# Patient Record
Sex: Male | Born: 1970 | Race: White | Hispanic: Yes | Smoking: Never smoker
Health system: Southern US, Community
[De-identification: ages and names within clinical notes are randomized; demographics above are authoritative.]

## PROBLEM LIST (undated history)

## (undated) DIAGNOSIS — T7840XA Allergy, unspecified, initial encounter: Secondary | ICD-10-CM

## (undated) DIAGNOSIS — E785 Hyperlipidemia, unspecified: Secondary | ICD-10-CM

## (undated) HISTORY — PX: EYE SURGERY: SHX253

## (undated) HISTORY — PX: KNEE ARTHROSCOPY W/ ACL RECONSTRUCTION: SHX1858

## (undated) HISTORY — DX: Hyperlipidemia, unspecified: E78.5

## (undated) HISTORY — DX: Allergy, unspecified, initial encounter: T78.40XA

---

## 2015-09-02 ENCOUNTER — Telehealth: Payer: Self-pay | Admitting: Behavioral Health

## 2015-09-02 NOTE — Telephone Encounter (Signed)
Unable to reach patient at time of Pre-Visit Call.  Left message for patient to return call when available.    

## 2015-09-03 ENCOUNTER — Encounter: Payer: Self-pay | Admitting: Physician Assistant

## 2015-09-03 ENCOUNTER — Ambulatory Visit (INDEPENDENT_AMBULATORY_CARE_PROVIDER_SITE_OTHER): Payer: BLUE CROSS/BLUE SHIELD | Admitting: Physician Assistant

## 2015-09-03 VITALS — BP 100/62 | HR 64 | Temp 97.7°F | Ht 71.0 in | Wt 175.0 lb

## 2015-09-03 DIAGNOSIS — K219 Gastro-esophageal reflux disease without esophagitis: Secondary | ICD-10-CM

## 2015-09-03 DIAGNOSIS — M654 Radial styloid tenosynovitis [de Quervain]: Secondary | ICD-10-CM | POA: Diagnosis not present
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
## 2015-09-03 NOTE — Patient Instructions (Signed)
Please continue your Nexium and bland diet (See the bottom of your visit summary for comprehensive diet). Start a daily probiotic (Align, Digestive Advantage, Culturelle) Limit alcohol. Symptoms should continue to resolve. If not please call me so I can set up a Gastroenterology consult.  For the hand, please wear the brace given over the next week. Apply topical Icy Hot to the area. Tylenol if needed for pain. Avoid heavy lifting.  Let me know if these symptoms are not resolving.  Lollie Sails Disease Lollie Sails disease is inflammation of the tendon on the thumb side of the wrist. Tendons are cords of tissue that connect bones to muscles. The tendons in your hand pass through a tunnel, or sheath. A slippery layer of tissue (synovium) lets the tendons move smoothly in the sheath. With de Quervain disease, the sheath swells or thickens, causing friction and pain. The condition is also called de Quervain tendinosis and de Quervain syndrome. It occurs most often in women who are 8-4 years old. CAUSES  The exact cause of de Quervain disease is not known. It may result from:   Overusing your hands, especially with repetitive motions that involve twisting your hand or using a forceful grip.  Pregnancy.  Rheumatoid disease. RISK FACTORS You may have a greater risk for de Quervain disease if you:  Are a middle-aged woman.  Are pregnant.  Have rheumatoid arthritis.  Have diabetes.  Use your hands far more than normal, especially with a tight grip or excessive twisting. SIGNS AND SYMPTOMS Pain on the thumb side of your wrist is the main symptom of de Quervain disease. Other signs and symptoms include:  Pain that gets worse when you grasp something or turn your wrist.  Pain that extends up the forearm.  Cysts in the area of the pain.  Swelling of your wrist and hand.  A sensation of snapping in the wrist.  Trouble moving the thumb and wrist. DIAGNOSIS  Your health care  provider may diagnose de Quervain disease based on your signs and symptoms. A physical exam will also be done. A simple test Lourena Simmonds test) that involves pulling your thumb and wrist to see if this causes pain can help determine whether you have the condition. Sometimes you may need to have an X-ray.  TREATMENT  Avoiding any activity that causes pain and swelling is the best treatment. Other options include:  Wearing a splint.  Taking medicine. Anti-inflammatory medicines and corticosteroid injections may reduce inflammation and relieve pain.  Having surgery if other treatments do not work. HOME CARE INSTRUCTIONS   Using ice can be helpful after doing activities that involve the sore wrist. To apply ice to the injured area:  Put ice in a plastic bag.  Place a towel between your skin and the bag.  Leave the ice on for 20 minutes, 2-3 times a day.  Take medicines only as directed by your health care provider.  Wear your splint as directed. This will allow your hand to rest and heal. SEEK MEDICAL CARE IF:   Your pain medicine does not help.   Your pain gets worse.  You develop new symptoms. MAKE SURE YOU:   Understand these instructions.  Will watch your condition.  Will get help right away if you are not doing well or get worse.   This information is not intended to replace advice given to you by your health care provider. Make sure you discuss any questions you have with your health care provider.  Document Released: 01/26/2001 Document Revised: 05/24/2014 Document Reviewed: 09/05/2013 Elsevier Interactive Patient Education 2016 [employer]  Food Choices for Gastroesophageal Reflux Disease, Adult When you have gastroesophageal reflux disease (GERD), the foods you eat and your eating habits are very important. Choosing the right foods can help ease the discomfort of GERD. WHAT GENERAL GUIDELINES DO I NEED TO FOLLOW?  Choose fruits, vegetables, whole grains,  low-fat dairy products, and low-fat meat, fish, and poultry.  Limit fats such as oils, salad dressings, butter, nuts, and avocado.  Keep a food diary to identify foods that cause symptoms.  Avoid foods that cause reflux. These may be different for different people.  Eat frequent small meals instead of three large meals each day.  Eat your meals slowly, in a relaxed setting.  Limit fried foods.  Cook foods using methods other than frying.  Avoid drinking alcohol.  Avoid drinking large amounts of liquids with your meals.  Avoid bending over or lying down until 2-3 hours after eating. WHAT FOODS ARE NOT RECOMMENDED? The following are some foods and drinks that may worsen your symptoms: Vegetables Tomatoes. Tomato juice. Tomato and spaghetti sauce. Chili peppers. Onion and garlic. Horseradish. Fruits Oranges, grapefruit, and lemon (fruit and juice). Meats High-fat meats, fish, and poultry. This includes hot dogs, ribs, ham, sausage, salami, and bacon. Dairy Whole milk and chocolate milk. Sour cream. Cream. Butter. Ice cream. Cream cheese.  Beverages Coffee and tea, with or without caffeine. Carbonated beverages or energy drinks. Condiments Hot sauce. Barbecue sauce.  Sweets/Desserts Chocolate and cocoa. Donuts. Peppermint and spearmint. Fats and Oils High-fat foods, including [place] fries and potato chips. Other Vinegar. Strong spices, such as black pepper, white pepper, red pepper, cayenne, curry powder, cloves, ginger, and chili powder. The items listed above may not be a complete list of foods and beverages to avoid. Contact your dietitian for more information.   This information is not intended to replace advice given to you by your health care provider. Make sure you discuss any questions you have with your health care provider.   Document Released: 05/03/2005 Document Revised: 05/24/2014 Document Reviewed: 03/07/2013 Elsevier Interactive Patient Education [employer]2016  [employer]vier Inc.

## 2015-09-03 NOTE — Progress Notes (Signed)
Pre visit review using our clinic review tool, if applicable. No additional management support is needed unless otherwise documented below in the visit note. 

## 2016-03-19 ENCOUNTER — Telehealth: Payer: Self-pay | Admitting: Physician Assistant

## 2016-03-19 NOTE — Telephone Encounter (Signed)
Pt sent a My Chart message. He would like to establish care with Dr. Carmelia RollerWendling due to current PCP moving to a different office.    Please advise, is switch okay?

## 2016-03-19 NOTE — Telephone Encounter (Signed)
Ok with me 

## 2016-03-24 NOTE — Telephone Encounter (Signed)
OK with me.

## 2016-03-26 DIAGNOSIS — J301 Allergic rhinitis due to pollen: Secondary | ICD-10-CM | POA: Diagnosis not present

## 2016-03-29 DIAGNOSIS — J3089 Other allergic rhinitis: Secondary | ICD-10-CM | POA: Diagnosis not present

## 2016-03-29 NOTE — Telephone Encounter (Signed)
Called pt. lvm to call back to schedule ov to transfer care.

## 2016-04-05 ENCOUNTER — Encounter: Payer: Self-pay | Admitting: Family Medicine

## 2016-04-05 ENCOUNTER — Ambulatory Visit (INDEPENDENT_AMBULATORY_CARE_PROVIDER_SITE_OTHER): Payer: BLUE CROSS/BLUE SHIELD | Admitting: Family Medicine

## 2016-04-05 VITALS — BP 112/66 | HR 73 | Temp 97.3°F | Ht 71.0 in | Wt 178.8 lb

## 2016-04-05 DIAGNOSIS — Z13220 Encounter for screening for lipoid disorders: Secondary | ICD-10-CM | POA: Diagnosis not present

## 2016-04-05 DIAGNOSIS — Z0000 Encounter for general adult medical examination without abnormal findings: Secondary | ICD-10-CM

## 2016-04-05 DIAGNOSIS — Z114 Encounter for screening for human immunodeficiency virus [HIV]: Secondary | ICD-10-CM | POA: Diagnosis not present

## 2016-04-05 LAB — LIPID PANEL
Cholesterol: 242 mg/dL — ABNORMAL HIGH (ref 0–200)
HDL: 67.4 mg/dL (ref >39.00)
LDL Cholesterol: 145 mg/dL — ABNORMAL HIGH (ref 0–99)
NONHDL: 174.94
TRIGLYCERIDES: 151 mg/dL — AB (ref 0.0–149.0)
Total CHOL/HDL Ratio: 4
VLDL: 30.2 mg/dL (ref 0.0–40.0)

## 2016-04-05 LAB — COMPREHENSIVE METABOLIC PANEL
ALK PHOS: 40 U/L (ref 39–117)
ALT: 15 U/L (ref 0–53)
AST: 17 U/L (ref 0–37)
Albumin: 4.5 g/dL (ref 3.5–5.2)
BUN: 20 mg/dL (ref 6–23)
CALCIUM: 9.5 mg/dL (ref 8.4–10.5)
CO2: 28 mEq/L (ref 19–32)
Chloride: 105 mEq/L (ref 96–112)
Creatinine, Ser: 1.17 mg/dL (ref 0.40–1.50)
GFR: 71.99 mL/min (ref >60.00)
GLUCOSE: 73 mg/dL (ref 70–99)
POTASSIUM: 4 meq/L (ref 3.5–5.1)
Sodium: 141 mEq/L (ref 135–145)
TOTAL PROTEIN: 7.3 g/dL (ref 6.0–8.3)
Total Bilirubin: 0.4 mg/dL (ref 0.2–1.2)

## 2016-04-05 NOTE — Progress Notes (Signed)
Chief Complaint  Patient presents with  . Transitions Of Care    cpe    Well Male Kathrine CordsSandro Budde is here for a complete physical.   His last physical was >1 year(s) ago.  Current diet: in general, a "healthy" diet   Current exercise: mountain biking, tennis, light exercises in gym Weight trend: stable Does pt snore? No. Daytime fatigue? No. Seat belt? Yes.   Concerns: None  Past Medical History:  Diagnosis Date  . Allergy    with allergic asthma -- well controlled; is on injections every 3 weeks  . Hyperlipidemia     Past Surgical History:  Procedure Laterality Date  . EYE SURGERY     Lasic  . KNEE ARTHROSCOPY W/ ACL RECONSTRUCTION  2008,2011   From soccer   Medications  Current Outpatient Prescriptions on File Prior to Visit  Medication Sig Dispense Refill  . Melatonin 5 MG TABS Take 1 tablet by mouth daily.     Allergies No Known Allergies Family History Family History  Problem Relation Age of Onset  . Hypertension Mother   . Hypertension Father   . Diabetes Father     Type II  . Hypertension Brother   . Stroke Paternal Grandfather   . Healthy Son 13    Review of Systems: Constitutional:  no unexpected change in weight, no fevers or chills Eye:  no recent significant change in vision Ear/Nose/Mouth/Throat:  Ears:  no tinnitus or hearing loss Nose/Mouth/Throat:  no complaints of nasal congestion or bleeding, no sore throat and oral sores Cardiovascular:  no chest pain, no palpitations Respiratory:  no cough and no shortness of breath Gastrointestinal:  no abdominal pain, no change in bowel habits, no nausea, vomiting, diarrhea, or constipation and no black or bloody stool GU:  Male: negative for dysuria, frequency, and incontinence and negative for prostate symptoms Musculoskeletal/Extremities:  no pain, redness, or swelling of the joints Integumentary (Skin/Breast):  no abnormal skin lesions reported Neurologic:  no headaches, no numbness,  tingling Endocrine:  weight changes, masses in the neck, heat/cold intolerance, bowel or skin changes, or cardiovascular system symptoms Hematologic/Lymphatic:  no abnormal bleeding, no HIV risk factors, no night sweats, no swollen nodes, no weight loss  Exam BP 112/66 (BP Location: Left Arm, Patient Position: Sitting, Cuff Size: Small)   Pulse 73   Temp 97.3 F (36.3 C) (Oral)   Ht 5\' 11"  (1.803 m)   Wt 178 lb 12.8 oz (81.1 kg)   SpO2 97%   BMI 24.94 kg/m  General:  well developed, well nourished, in no apparent distress Skin:  no significant moles, warts, or growths Head:  no masses, lesions, or tenderness Eyes:  pupils equal and round, sclera anicteric without injection Ears:  canals without lesions, TMs shiny without retraction, no obvious effusion, no erythema Nose:  nares patent, septum midline, mucosa normal Throat/Pharynx:  lips and gingiva without lesion; tongue and uvula midline; non-inflamed pharynx; no exudates or postnasal drainage Neck: neck supple without adenopathy, thyromegaly, or masses Lungs:  clear to auscultation, breath sounds equal bilaterally, no respiratory distress Cardio:  regular rate and rhythm without murmurs, heart sounds without clicks or rubs Abdomen:  abdomen soft, nontender; bowel sounds normal; no masses or organomegaly Genital (male): Uncircumcised penis, no lesions or discharge; testes present bilaterally without masses or tenderness Rectal: Deferred Musculoskeletal:  symmetrical muscle groups noted without atrophy or deformity Extremities:  no clubbing, cyanosis, or edema, no deformities, no skin discoloration Neuro:  gait normal; deep tendon reflexes normal  and symmetric Psych: well oriented with normal range of affect and appropriate judgment/insight  Assessment and Plan  Well adult exam - Plan: Comprehensive metabolic panel  Screening cholesterol level - Plan: Lipid panel  Encounter for screening for HIV - Plan: HIV antibody   Well 45  y.o. male. Counseled on diet and exercise. Immunizations, labs, and further orders as above. Follow up in 1 year pending the above workup. The patient voiced understanding and agreement to the plan.  Jilda Rocheicholas Paul Bon[place]Wendling, DO 04/05/16 2:10 PM

## 2016-04-05 NOTE — Progress Notes (Signed)
Pre visit review using our clinic review tool, if applicable. No additional management support is needed unless otherwise documented below in the visit note. 

## 2016-04-06 LAB — HIV ANTIBODY (ROUTINE TESTING W REFLEX): HIV 1&2 Ab, 4th Generation: NONREACTIVE

## 2016-04-13 DIAGNOSIS — S43101D Unspecified dislocation of right acromioclavicular joint, subsequent encounter: Secondary | ICD-10-CM | POA: Diagnosis not present

## 2017-03-30 ENCOUNTER — Telehealth: Payer: Self-pay | Admitting: Family Medicine

## 2017-03-30 NOTE — Telephone Encounter (Signed)
OK w me.  

## 2017-03-30 NOTE — Telephone Encounter (Signed)
l looks like he was seen about a year ago. So looks like due to CPE. Have him schedule sometime early December. Fasting early morning appointment. I am ok if he wants to switch.

## 2017-03-30 NOTE — Telephone Encounter (Signed)
Pt called states he did not feel a connection with Wendling and would like to switch to [facility] as his [facility]. Please advise. Call pt on mobile phone with decision.

## 2017-04-01 NOTE — Telephone Encounter (Signed)
Called pt x 2 unable to leave msg. Pt is ok to schedule appt with edward per previous note.

## 2017-04-13 ENCOUNTER — Encounter: Payer: Self-pay | Admitting: Family Medicine

## 2017-04-13 ENCOUNTER — Ambulatory Visit: Payer: BLUE CROSS/BLUE SHIELD | Admitting: Family Medicine

## 2017-04-13 VITALS — BP 122/70 | HR 73 | Temp 98.5°F | Ht 71.0 in | Wt 187.5 lb

## 2017-04-13 DIAGNOSIS — Z23 Encounter for immunization: Secondary | ICD-10-CM

## 2017-04-13 DIAGNOSIS — R5383 Other fatigue: Secondary | ICD-10-CM | POA: Diagnosis not present

## 2017-04-13 DIAGNOSIS — Z0000 Encounter for general adult medical examination without abnormal findings: Secondary | ICD-10-CM

## 2017-04-13 NOTE — Patient Instructions (Signed)
Come to your labs fasting for 9-12 hours prior to the draw.   Keep up the good work.  Let us know if you need anything.

## 2017-04-13 NOTE — Progress Notes (Signed)
Pre visit review using our clinic review tool, if applicable. No additional management support is needed unless otherwise documented below in the visit note. 

## 2017-04-13 NOTE — Progress Notes (Signed)
Chief Complaint  Patient presents with  . Annual Exam    Well Male Bradley Stephenson is here for a complete physical.   His last physical was >1 year ago.  Current diet: in general, a "healthy" diet   Current exercise: mountain biking, lifting weights Weight trend: Has been stable Does pt snore? No. Daytime fatigue? Yes.  Seat belt? Yes.    Health maintenance Tetanus- Yes HIV- Yes   One year ago, pt had a shoulder injury and stopped workout out as much. He gained around 16 lbs. In the past, when something like this happened, the weight would shed when he started resuming his normal activity, but this time it didn't. He has been back to his overall normal routine since 3 mo ago and has not lost the weight. He is also having daytime fatigue. He denies heme issues, snoring, depressed mood, or thyroid issues. His brother has a hx of low T. His libido is unchanged.   Past Medical History:  Diagnosis Date  . Allergy    with allergic asthma -- well controlled; is on injections every 3 weeks  . Hyperlipidemia     Past Surgical History:  Procedure Laterality Date  . EYE SURGERY     Lasic  . KNEE ARTHROSCOPY W/ ACL RECONSTRUCTION  2008,2011   From soccer   Medications  Current Outpatient Medications on File Prior to Visit  Medication Sig Dispense Refill  . Melatonin 5 MG TABS Take 1 tablet by mouth daily.     Allergies No Known Allergies   Family History Family History  Problem Relation Age of Onset  . Hypertension Mother   . Hypertension Father   . Diabetes Father        Type II  . Hypertension Brother   . Stroke Paternal Grandfather   . Healthy Son 13    Review of Systems: Constitutional: no fevers or chills Eye:  no recent significant change in vision Ear/Nose/Mouth/Throat:  Ears:  no tinnitus or hearing loss Nose/Mouth/Throat:  no complaints of nasal congestion or bleeding, no sore throat Cardiovascular:  no chest pain, no palpitations Respiratory:  no cough and no  shortness of breath Gastrointestinal:  no abdominal pain, no change in bowel habits, no nausea, vomiting, diarrhea, or constipation and no black or bloody stool GU:  Male: negative for dysuria, frequency, and incontinence and negative for prostate symptoms Musculoskeletal/Extremities:  no pain, redness, or swelling of the joints Integumentary (Skin/Breast):  no abnormal skin lesions reported Neurologic:  no headaches, no numbness, tingling Endocrine: +fatigue/weight gain Hematologic/Lymphatic:  no night sweats  Exam BP 122/70 (BP Location: Left Arm, Patient Position: Sitting, Cuff Size: Normal)   Pulse 73   Temp 98.5 F (36.9 C) (Oral)   Ht 5\' 11"  (1.803 m)   Wt 187 lb 8 oz (85 kg)   SpO2 96%   BMI 26.15 kg/m  General:  well developed, well nourished, in no apparent distress Skin:  no significant moles, warts, or growths Head:  no masses, lesions, or tenderness Eyes:  pupils equal and round, sclera anicteric without injection Ears:  canals without lesions, TMs shiny without retraction, no obvious effusion, no erythema Nose:  nares patent, septum midline, mucosa normal Throat/Pharynx:  lips and gingiva without lesion; tongue and uvula midline; non-inflamed pharynx; no exudates or postnasal drainage Neck: neck supple without adenopathy, thyromegaly, or masses Lungs:  clear to auscultation, breath sounds equal bilaterally, no respiratory distress Cardio:  regular rate and rhythm without murmurs, heart sounds without clicks  or rubs Abdomen:  abdomen soft, nontender; bowel sounds normal; no masses or organomegaly Genital (male): Nml penis, no lesions or discharge; testes present bilaterally without masses or tenderness Rectal: Deferred Musculoskeletal:  symmetrical muscle groups noted without atrophy or deformity Extremities:  no clubbing, cyanosis, or edema, no deformities, no skin discoloration Neuro:  gait normal; deep tendon reflexes normal and symmetric Psych: well oriented with  normal range of affect and appropriate judgment/insight  Assessment and Plan  Well adult exam - Plan: CBC, Comprehensive metabolic panel, Lipid panel  Fatigue, unspecified type - Plan: TSH, Testosterone  Need for influenza vaccination - Plan: Flu Vaccine QUAD 6+ mos PF IM (Fluarix Quad PF)   Well 46 y.o. male. Counseled on diet and exercise. He is doing well. F/u for fasting labs. He is transitioning care to a new provider in the office and I will defer further management to him after managing the labs I am ordering.  Other orders as above. The patient voiced understanding and agreement to the plan.  Jilda Rocheicholas Paul [place], DO 04/13/17 4:00 PM

## 2017-04-15 ENCOUNTER — Other Ambulatory Visit (INDEPENDENT_AMBULATORY_CARE_PROVIDER_SITE_OTHER): Payer: BLUE CROSS/BLUE SHIELD

## 2017-04-15 DIAGNOSIS — R5383 Other fatigue: Secondary | ICD-10-CM | POA: Diagnosis not present

## 2017-04-15 DIAGNOSIS — Z0000 Encounter for general adult medical examination without abnormal findings: Secondary | ICD-10-CM | POA: Diagnosis not present

## 2017-04-15 LAB — COMPREHENSIVE METABOLIC PANEL
ALT: 27 U/L (ref 0–53)
AST: 21 U/L (ref 0–37)
Albumin: 4.4 g/dL (ref 3.5–5.2)
Alkaline Phosphatase: 34 U/L — ABNORMAL LOW (ref 39–117)
BILIRUBIN TOTAL: 0.5 mg/dL (ref 0.2–1.2)
BUN: 14 mg/dL (ref 6–23)
CHLORIDE: 102 meq/L (ref 96–112)
CO2: 31 meq/L (ref 19–32)
Calcium: 9.9 mg/dL (ref 8.4–10.5)
Creatinine, Ser: 1.11 mg/dL (ref 0.40–1.50)
GFR: 76.14 mL/min (ref >60.00)
GLUCOSE: 90 mg/dL (ref 70–99)
POTASSIUM: 4.1 meq/L (ref 3.5–5.1)
Sodium: 138 mEq/L (ref 135–145)
Total Protein: 7.3 g/dL (ref 6.0–8.3)

## 2017-04-15 LAB — CBC
HEMATOCRIT: 42.8 % (ref 39.0–52.0)
HEMOGLOBIN: 14.4 g/dL (ref 13.0–17.0)
MCHC: 33.6 g/dL (ref 30.0–36.0)
MCV: 88.8 fl (ref 78.0–100.0)
PLATELETS: 229 10*3/uL (ref 150.0–400.0)
RBC: 4.81 Mil/uL (ref 4.22–5.81)
RDW: 12.9 % (ref 11.5–15.5)
WBC: 5.1 10*3/uL (ref 4.0–10.5)

## 2017-04-15 LAB — LIPID PANEL
CHOL/HDL RATIO: 4
Cholesterol: 269 mg/dL — ABNORMAL HIGH (ref 0–200)
HDL: 60.1 mg/dL (ref >39.00)
LDL CALC: 191 mg/dL — AB (ref 0–99)
NONHDL: 209.21
Triglycerides: 89 mg/dL (ref 0.0–149.0)
VLDL: 17.8 mg/dL (ref 0.0–40.0)

## 2017-04-15 LAB — TSH: TSH: 1.92 u[IU]/mL (ref 0.35–4.50)

## 2017-04-15 LAB — TESTOSTERONE: TESTOSTERONE: 370.36 ng/dL (ref 300.00–890.00)

## 2017-04-18 ENCOUNTER — Other Ambulatory Visit: Payer: Self-pay | Admitting: Family Medicine

## 2017-04-18 DIAGNOSIS — E785 Hyperlipidemia, unspecified: Secondary | ICD-10-CM

## 2017-05-03 ENCOUNTER — Other Ambulatory Visit (INDEPENDENT_AMBULATORY_CARE_PROVIDER_SITE_OTHER): Payer: BLUE CROSS/BLUE SHIELD

## 2017-05-03 ENCOUNTER — Other Ambulatory Visit: Payer: BLUE CROSS/BLUE SHIELD

## 2017-05-03 DIAGNOSIS — E785 Hyperlipidemia, unspecified: Secondary | ICD-10-CM | POA: Diagnosis not present

## 2017-05-03 LAB — LIPID PANEL
CHOL/HDL RATIO: 3
Cholesterol: 229 mg/dL — ABNORMAL HIGH (ref 0–200)
HDL: 68.9 mg/dL (ref >39.00)
LDL Cholesterol: 138 mg/dL — ABNORMAL HIGH (ref 0–99)
NONHDL: 160.38
Triglycerides: 114 mg/dL (ref 0.0–149.0)
VLDL: 22.8 mg/dL (ref 0.0–40.0)

## 2017-10-17 ENCOUNTER — Ambulatory Visit (HOSPITAL_BASED_OUTPATIENT_CLINIC_OR_DEPARTMENT_OTHER)
Admission: RE | Admit: 2017-10-17 | Discharge: 2017-10-17 | Disposition: A | Discharge status: Home / Self Care | Payer: BLUE CROSS/BLUE SHIELD | Source: Ambulatory Visit | Attending: Family | Admitting: Family

## 2017-10-17 ENCOUNTER — Ambulatory Visit: Payer: BLUE CROSS/BLUE SHIELD | Admitting: Family

## 2017-10-17 ENCOUNTER — Encounter: Payer: Self-pay | Admitting: Family

## 2017-10-17 VITALS — BP 148/93 | HR 68 | Temp 98.8°F | Resp 16 | Ht 71.0 in | Wt 189.2 lb

## 2017-10-17 DIAGNOSIS — M79671 Pain in right foot: Secondary | ICD-10-CM

## 2017-10-17 DIAGNOSIS — M19071 Primary osteoarthritis, right ankle and foot: Secondary | ICD-10-CM | POA: Insufficient documentation

## 2017-10-17 NOTE — Patient Instructions (Signed)
Please complete x-ray on the first floor.  We will contact you via mychart with your results.

## 2017-10-17 NOTE — Progress Notes (Signed)
Subjective:    Patient ID: Bradley Stephenson, male    DOB: 12/31/1970, 47 y.o.   MRN: [id number]  HPI  Bradley Stephenson is a 47 yr old male who presents today with chief complaint of right sided foot pain. Reports that he injured her foot on Saturday while playing soccer.  Was playing soccer barefoot and went to kick the ball at the same time as his opponent.  Developed pain. Took cataflam and elevated foot. Doesn't hurt that much except to bend the right great toe. Wore some thick soled shoes yesterday and did not have much pain. Wants to make sure it is not broken because he wants to go mountain biking.    Review of Systems See HPI  Past Medical History:  Diagnosis Date  . Allergy    with allergic asthma -- well controlled; is on injections every 3 weeks  . Hyperlipidemia      Social History   Socioeconomic History  . Marital status: Married    Spouse name: Not on file  . Number of children: 1  . Years of education: Not on file  . Highest education level: Not on file  Occupational History  . Occupation: Electronics engineer: [employer]  Social Needs  . Financial resource strain: Not on file  . Food insecurity:    Worry: Not on file    Inability: Not on file  . Transportation needs:    Medical: Not on file    Non-medical: Not on file  Tobacco Use  . Smoking status: Never Smoker  . Smokeless tobacco: Never Used  Substance and Sexual Activity  . Alcohol use: Yes    Alcohol/week: 2.4 oz    Types: 4 Standard drinks or equivalent per week    Comment: Social   . Drug use: No  . Sexual activity: Yes    Partners: Female  Lifestyle  . Physical activity:    Days per week: Not on file    Minutes per session: Not on file  . Stress: Not on file  Relationships  . Social connections:    Talks on phone: Not on file    Gets together: Not on file    Attends religious service: Not on file    Active member of club or organization: Not on file    Attends meetings of clubs or  organizations: Not on file    Relationship status: Not on file  . Intimate partner violence:    Fear of current or ex partner: Not on file    Emotionally abused: Not on file    Physically abused: Not on file    Forced sexual activity: Not on file  Other Topics Concern  . Not on file  Social History Narrative  . Not on file    Past Surgical History:  Procedure Laterality Date  . EYE SURGERY     Lasic  . KNEE ARTHROSCOPY W/ ACL RECONSTRUCTION  2008,2011   From soccer    Family History  Problem Relation Age of Onset  . Hypertension Mother   . Hypertension Father   . Diabetes Father        Type II  . Hypertension Brother   . Stroke Paternal Grandfather   . Healthy Son 13    No Known Allergies  Current Outpatient Medications on File Prior to Visit  Medication Sig Dispense Refill  . Melatonin 5 MG TABS Take 1 tablet by mouth daily.     No current facility-administered medications  on file prior to visit.     BP (!) 148/93 (BP Location: Left Arm, Cuff Size: Normal)   Pulse 68   Temp 98.8 F (37.1 C) (Oral)   Resp 16   Ht 5\' 11"  (1.803 m)   Wt 189 lb 3.2 oz (85.8 kg)   SpO2 98%   BMI 26.39 kg/m       Objective:   Physical Exam  Constitutional: He is oriented to person, place, and time. He appears well-developed and well-nourished.  HENT:  Head: Normocephalic and atraumatic.  Eyes: No scleral icterus.  Musculoskeletal:  + ecchymosis base of right great toe with mild associated swelling,  No erythema  Neurological: He is alert and oriented to person, place, and time.          Assessment & Plan:  R foot pain- will obtain x-ray to rule out fracture. If + for fracture will plan referral to sports med.

## 2018-03-27 DIAGNOSIS — H16142 Punctate keratitis, left eye: Secondary | ICD-10-CM | POA: Diagnosis not present

## 2018-03-27 DIAGNOSIS — T2612XA Burn of cornea and conjunctival sac, left eye, initial encounter: Secondary | ICD-10-CM | POA: Diagnosis not present

## 2018-03-27 DIAGNOSIS — H1712 Central corneal opacity, left eye: Secondary | ICD-10-CM | POA: Diagnosis not present

## 2018-06-27 ENCOUNTER — Telehealth: Payer: Self-pay

## 2018-06-27 NOTE — Telephone Encounter (Signed)
Okay to schedule NP appt w/ Dr. Drue Novel at his convenience.

## 2018-06-27 NOTE — Telephone Encounter (Signed)
Please advise 

## 2018-06-27 NOTE — Telephone Encounter (Signed)
Copied from CRM [id number]. Topic: Appointment Scheduling - Transfer of Care >> Jun 27, 2018  1:45 PM Angela Nevin wrote: Pt is requesting to transfer FROM: Dr.Wendling  Pt is requesting to transfer TO: Dr. Drue Novel Reason for requested transfer: Just does not feel like Dr. Carmelia Roller is a good fit for him.

## 2018-06-27 NOTE — Telephone Encounter (Signed)
Ok if ok w/ Dr Carmelia RollerWendling

## 2018-06-28 NOTE — Telephone Encounter (Signed)
Looks like the appt on 08/02/2018 was accidentally scheduled w/ Dr. Carmelia Roller.

## 2018-08-02 ENCOUNTER — Encounter: Payer: BLUE CROSS/BLUE SHIELD | Admitting: Internal Medicine

## 2018-08-02 ENCOUNTER — Encounter: Payer: BLUE CROSS/BLUE SHIELD | Admitting: Family Medicine

## 2019-01-08 ENCOUNTER — Other Ambulatory Visit: Payer: Self-pay

## 2019-01-09 ENCOUNTER — Encounter: Payer: Self-pay | Admitting: Internal Medicine

## 2019-01-09 ENCOUNTER — Ambulatory Visit (INDEPENDENT_AMBULATORY_CARE_PROVIDER_SITE_OTHER): Payer: BC Managed Care – PPO | Admitting: Internal Medicine

## 2019-01-09 VITALS — BP 144/95 | HR 63 | Temp 97.9°F | Resp 16 | Ht 70.0 in | Wt 182.0 lb

## 2019-01-09 DIAGNOSIS — Z0000 Encounter for general adult medical examination without abnormal findings: Secondary | ICD-10-CM | POA: Insufficient documentation

## 2019-01-09 NOTE — Patient Instructions (Signed)
   GO TO THE FRONT DESK Schedule labs for tomorrow morning Schedule  your next appointment for a physical  In 1 year       Check the  blood pressure 2   times a month   BP GOAL is between 110/65 and  135/85. If it is consistently higher or lower, let me know

## 2019-01-09 NOTE — Progress Notes (Signed)
Subjective:    Patient ID: Bradley Stephenson, male    DOB: 01/09/1971, 48 y.o.   MRN: [id number]  DOS:  01/09/2019 Type of visit - description: Physical exam, new patient to me Has no major concerns.  From time to time he feels slightly tired but otherwise he is very active, goes to the gym 3 times a week does mountain biking.   Review of Systems  Other than above, a 14 point review of systems is negative     Past Medical History:  Diagnosis Date  . Allergy    with allergic asthma -- well controlled; is on injections every 3 weeks  . Hyperlipidemia     Past Surgical History:  Procedure Laterality Date  . EYE SURGERY     Lasic  . KNEE ARTHROSCOPY W/ ACL RECONSTRUCTION Bilateral 2008,2011   From soccer    Social History   Socioeconomic History  . Marital status: Married    Spouse name: Not on file  . Number of children: 1  . Years of education: Not on file  . Highest education level: Not on file  Occupational History  . Occupation: Electronics engineerMachanical Engineer    Employer: [employer]  Social Needs  . Financial resource strain: Not on file  . Food insecurity    Worry: Not on file    Inability: Not on file  . Transportation needs    Medical: Not on file    Non-medical: Not on file  Tobacco Use  . Smoking status: Never Smoker  . Smokeless tobacco: Never Used  Substance and Sexual Activity  . Alcohol use: Yes    Alcohol/week: 4.0 standard drinks    Types: 4 Standard drinks or equivalent per week    Comment: Social   . Drug use: No  . Sexual activity: Yes    Partners: Female  Lifestyle  . Physical activity    Days per week: Not on file    Minutes per session: Not on file  . Stress: Not on file  Relationships  . Social Musicianconnections    Talks on phone: Not on file    Gets together: Not on file    Attends religious service: Not on file    Active member of club or organization: Not on file    Attends meetings of clubs or organizations: Not on file    Relationship status: Not  on file  . Intimate partner violence    Fear of current or ex partner: Not on file    Emotionally abused: Not on file    Physically abused: Not on file    Forced sexual activity: Not on file  Other Topics Concern  . Not on file  Social History Narrative   From [place] , [place]   Active, gym, MT[employer]     Family History  Problem Relation Age of Onset  . Hypertension Mother   . Hypertension Father   . Diabetes Father        Type II  . Hypertension Brother   . Stroke Paternal Grandfather   . Healthy Son [id number]  . Colon cancer Neg Hx   . Prostate cancer Neg Hx      Allergies as of 01/09/2019   No Known Allergies     Medication List       Accurate as of January 09, 2019 11:59 PM. If you have any questions, ask your nurse or doctor.        Melatonin 5 MG Tabs Take 1 tablet by mouth  daily.           Objective:   Physical Exam BP (!) 144/95 (BP Location: Left Arm, Patient Position: Sitting, Cuff Size: Small)   Pulse 63   Temp 97.9 F (36.6 C) (Temporal)   Resp 16   Ht 5\' 10"  (1.778 m)   Wt 182 lb (82.6 kg)   SpO2 99%   BMI 26.11 kg/m  General: Well developed, NAD, BMI noted Neck: No  thyromegaly  HEENT:  Normocephalic . Face symmetric, atraumatic Lungs:  CTA B Normal respiratory effort, no intercostal retractions, no accessory muscle use. Heart: RRR,  no murmur.  No pretibial edema bilaterally  Abdomen:  Not distended, soft, non-tender. No rebound or rigidity.   Skin: Exposed areas without rash. Not pale. Not jaundice Neurologic:  alert & oriented X3.  Speech normal, gait appropriate for age and unassisted Strength symmetric and appropriate for age.  Psych: Cognition and judgment appear intact.  Cooperative with normal attention span and concentration.  Behavior appropriate. No anxious or depressed appearing.     Assessment    ASSESSMENT Healthy  Plan: Here for CPX.  No major concerns. BP slightly elevated today, he checks his blood pressure  several times a year including when he has a physical at work BPs typically 120/80.  Will ask him to check his BPs twice a month.  See AVS. RTC 1 year

## 2019-01-09 NOTE — Assessment & Plan Note (Addendum)
-  Td 2011 -Recommend flu shot this year - Not due for a colon or Prostate screening - Lifestyle is very healthy, very active. - Labs: Will come back fasting in the morning for a CMP, FLP, CBC TSH total and free testosterone (per his request). - Previous labs reviewed, testosterone was normal -Also, 10-year CV RF with a last cholesterol available: 2.4%

## 2019-01-10 ENCOUNTER — Other Ambulatory Visit: Payer: Self-pay

## 2019-01-10 ENCOUNTER — Other Ambulatory Visit (INDEPENDENT_AMBULATORY_CARE_PROVIDER_SITE_OTHER): Payer: BC Managed Care – PPO

## 2019-01-10 DIAGNOSIS — Z0000 Encounter for general adult medical examination without abnormal findings: Secondary | ICD-10-CM

## 2019-01-10 LAB — COMPREHENSIVE METABOLIC PANEL
ALT: 27 U/L (ref 0–53)
AST: 24 U/L (ref 0–37)
Albumin: 4.5 g/dL (ref 3.5–5.2)
Alkaline Phosphatase: 32 U/L — ABNORMAL LOW (ref 39–117)
BUN: 17 mg/dL (ref 6–23)
CO2: 29 mEq/L (ref 19–32)
Calcium: 9.4 mg/dL (ref 8.4–10.5)
Chloride: 102 mEq/L (ref 96–112)
Creatinine, Ser: 1.23 mg/dL (ref 0.40–1.50)
GFR: 63.15 mL/min (ref >60.00)
Glucose, Bld: 92 mg/dL (ref 70–99)
Potassium: 4.4 mEq/L (ref 3.5–5.1)
Sodium: 138 mEq/L (ref 135–145)
Total Bilirubin: 0.6 mg/dL (ref 0.2–1.2)
Total Protein: 6.9 g/dL (ref 6.0–8.3)

## 2019-01-10 LAB — CBC WITH DIFFERENTIAL/PLATELET
Basophils Absolute: 0 10*3/uL (ref 0.0–0.1)
Basophils Relative: 0.4 % (ref 0.0–3.0)
Eosinophils Absolute: 0.1 10*3/uL (ref 0.0–0.7)
Eosinophils Relative: 2 % (ref 0.0–5.0)
HCT: 40 % (ref 39.0–52.0)
Hemoglobin: 13.5 g/dL (ref 13.0–17.0)
Lymphocytes Relative: 46.2 % — ABNORMAL HIGH (ref 12.0–46.0)
Lymphs Abs: 2.5 10*3/uL (ref 0.7–4.0)
MCHC: 33.7 g/dL (ref 30.0–36.0)
MCV: 89.6 fl (ref 78.0–100.0)
Monocytes Absolute: 0.4 10*3/uL (ref 0.1–1.0)
Monocytes Relative: 7.7 % (ref 3.0–12.0)
Neutro Abs: 2.3 10*3/uL (ref 1.4–7.7)
Neutrophils Relative %: 43.7 % (ref 43.0–77.0)
Platelets: 221 10*3/uL (ref 150.0–400.0)
RBC: 4.46 Mil/uL (ref 4.22–5.81)
RDW: 13.5 % (ref 11.5–15.5)
WBC: 5.4 10*3/uL (ref 4.0–10.5)

## 2019-01-10 LAB — LIPID PANEL
Cholesterol: 261 mg/dL — ABNORMAL HIGH (ref 0–200)
HDL: 60.2 mg/dL (ref >39.00)
LDL Cholesterol: 188 mg/dL — ABNORMAL HIGH (ref 0–99)
NonHDL: 201.22
Total CHOL/HDL Ratio: 4
Triglycerides: 68 mg/dL (ref 0.0–149.0)
VLDL: 13.6 mg/dL (ref 0.0–40.0)

## 2019-01-10 LAB — TSH: TSH: 1.3 u[IU]/mL (ref 0.35–4.50)

## 2019-01-11 LAB — TESTOSTERONE TOTAL,FREE,BIO, MALES
Albumin: 4.3 g/dL (ref 3.6–5.1)
Sex Hormone Binding: 34 nmol/L (ref 10–50)
Testosterone, Bioavailable: 98.1 ng/dL — ABNORMAL LOW (ref >=110.0)
Testosterone, Free: 49.8 pg/mL (ref 46.0–224.0)
Testosterone: 387 ng/dL (ref 250–827)

## 2019-06-13 DIAGNOSIS — Z20828 Contact with and (suspected) exposure to other viral communicable diseases: Secondary | ICD-10-CM | POA: Diagnosis not present

## 2019-07-06 ENCOUNTER — Ambulatory Visit: Payer: BC Managed Care – PPO | Admitting: Internal Medicine

## 2019-07-06 ENCOUNTER — Encounter: Payer: Self-pay | Admitting: Internal Medicine

## 2019-07-06 ENCOUNTER — Other Ambulatory Visit: Payer: Self-pay

## 2019-07-06 VITALS — BP 148/81 | HR 72 | Temp 96.5°F | Resp 16 | Ht 70.0 in | Wt 189.1 lb

## 2019-07-06 DIAGNOSIS — R030 Elevated blood-pressure reading, without diagnosis of hypertension: Secondary | ICD-10-CM | POA: Diagnosis not present

## 2019-07-06 DIAGNOSIS — J4520 Mild intermittent asthma, uncomplicated: Secondary | ICD-10-CM

## 2019-07-06 DIAGNOSIS — J45909 Unspecified asthma, uncomplicated: Secondary | ICD-10-CM | POA: Insufficient documentation

## 2019-07-06 MED ORDER — ALBUTEROL SULFATE HFA 108 (90 BASE) MCG/ACT IN AERS: 2.0000 | INHALATION_SPRAY | Freq: Four times a day (QID) | RESPIRATORY_TRACT | 1 refills | Status: DC | PRN

## 2019-07-06 NOTE — Progress Notes (Signed)
Subjective:    Patient ID: Bradley Stephenson, male    DOB: Jul 25, 1970, 49 y.o.   MRN: [id number]  DOS:  07/06/2019 Type of visit - description: acute The patient went to visit his family in [place] during the Christmas and had to stay there several weeks due to the quarantine. While in [place] he was not exercising, not eating healthy and he is somewhat anxious. BPs were elevated in the 150s, 160.  His brother is a physician and he check it for him.  He came back to [place] 4 weeks ago, he is back on a very healthy diet and routine exercise. Initially BPs were in the 150s, in the last couple of weeks they are in the 120s.  Also has a history of asthma, he is able to exercise without any problems except when the allergy season is high, he goes mountain biking and he gets short of breath without cough. Requesting inhaler.    Review of Systems Denies chest pain, difficulty breathing or lower extremity edema Did have a mild headache at times while he was in [place]  Past Medical History:  Diagnosis Date  . Allergy    with allergic asthma -- well controlled; is on injections every 3 weeks  . Hyperlipidemia     Past Surgical History:  Procedure Laterality Date  . EYE SURGERY     Lasic  . KNEE ARTHROSCOPY W/ ACL RECONSTRUCTION Bilateral 9562,1308   From soccer    Allergies as of 07/06/2019   No Known Allergies     Medication List       Accurate as of July 06, 2019  3:43 PM. If you have any questions, ask your nurse or doctor.        Melatonin 5 MG Tabs Take 1 tablet by mouth daily.             Objective:   Physical Exam BP (!) 148/81 (BP Location: Left Arm, Patient Position: Sitting, Cuff Size: Small)   Pulse 72   Temp (!) 96.5 F (35.8 C) (Temporal)   Resp 16   Ht 5\' 10"  (1.778 m)   Wt 189 lb 2 oz (85.8 kg)   SpO2 97%   BMI 27.14 kg/m  General:   Well developed, NAD, BMI noted. HEENT:  Normocephalic . Face symmetric, atraumatic Lungs:  CTA B Normal  respiratory effort, no intercostal retractions, no accessory muscle use. Heart: RRR,  no murmur.  Lower extremities: no pretibial edema bilaterally  Skin: Not pale. Not jaundice Neurologic:  alert & oriented X3.  Speech normal, gait appropriate for age and unassisted Psych--  Cognition and judgment appear intact.  Cooperative with normal attention span and concentration.  Behavior appropriate. No anxious or depressed appearing.      Assessment     ASSESSMENT Elevated BP without dx of HTN Asthma  PLAN Elevated BP without diagnosis of hypertension: BP has been elevated in the context of stress, unhealthy diet, lack of exercise.  I rechecked the blood pressure myself: 125/85.  Recommend to continue checking BPs healthy lifestyle, let me know if BPs are elevated Asthma: Saw an allergist in [place] few years ago, diagnosed with asthma, request inhaler to take preexercise during allergy season , prescription sent.   This visit occurred during the SARS-CoV-2 public health emergency.  Safety protocols were in place, including screening questions prior to the visit, additional usage of staff PPE, and extensive cleaning of exam room while observing appropriate contact time as indicated for disinfecting solutions.

## 2019-07-06 NOTE — Patient Instructions (Addendum)
  GO TO THE FRONT DESK Come back for a physical by  12/2019 .    Check the  blood pressures BP GOAL is between 110/65 and  135/85. If it is consistently higher or lower, let me know

## 2019-07-06 NOTE — Progress Notes (Signed)
Pre visit review using our clinic review tool, if applicable. No additional management support is needed unless otherwise documented below in the visit note. 

## 2019-12-10 DIAGNOSIS — Z630 Problems in relationship with spouse or partner: Secondary | ICD-10-CM | POA: Diagnosis not present

## 2019-12-17 DIAGNOSIS — Z630 Problems in relationship with spouse or partner: Secondary | ICD-10-CM | POA: Diagnosis not present

## 2020-01-04 ENCOUNTER — Encounter: Payer: Self-pay | Admitting: Internal Medicine

## 2020-01-04 ENCOUNTER — Ambulatory Visit: Payer: BC Managed Care – PPO | Admitting: Internal Medicine

## 2020-01-04 ENCOUNTER — Other Ambulatory Visit: Payer: Self-pay

## 2020-01-04 VITALS — BP 147/91 | HR 98 | Temp 98.1°F | Resp 16 | Ht 70.0 in | Wt 178.0 lb

## 2020-01-04 DIAGNOSIS — Z23 Encounter for immunization: Secondary | ICD-10-CM

## 2020-01-04 DIAGNOSIS — Z0000 Encounter for general adult medical examination without abnormal findings: Secondary | ICD-10-CM | POA: Diagnosis not present

## 2020-01-04 DIAGNOSIS — R739 Hyperglycemia, unspecified: Secondary | ICD-10-CM | POA: Diagnosis not present

## 2020-01-04 NOTE — Progress Notes (Signed)
Pre visit review using our clinic review tool, if applicable. No additional management support is needed unless otherwise documented below in the visit note. 

## 2020-01-04 NOTE — Patient Instructions (Addendum)
Check the  blood pressure regularly BP GOAL is between 110/65 and  135/85. If it is consistently higher or lower, let me know   GO TO THE LAB : Get the blood work     GO TO THE FRONT DESK, PLEASE SCHEDULE YOUR APPOINTMENTS Come back for a physical in 1 year    Tdap (Tetanus, Diphtheria, Pertussis) Vaccine: What You Need to Know 1. Why get vaccinated? Tdap vaccine can prevent tetanus, diphtheria, and pertussis. Diphtheria and pertussis spread from person to person. Tetanus enters the body through cuts or wounds.  TETANUS (T) causes painful stiffening of the muscles. Tetanus can lead to serious health problems, including being unable to open the mouth, having trouble swallowing and breathing, or death.  DIPHTHERIA (D) can lead to difficulty breathing, heart failure, paralysis, or death.  PERTUSSIS (aP), also known as "whooping cough," can cause uncontrollable, violent coughing which makes it hard to breathe, eat, or drink. Pertussis can be extremely serious in babies and young children, causing pneumonia, convulsions, brain damage, or death. In teens and adults, it can cause weight loss, loss of bladder control, passing out, and rib fractures from severe coughing. 2. Tdap vaccine Tdap is only for children 7 years and older, adolescents, and adults.  Adolescents should receive a single dose of Tdap, preferably at age 63 or 12 years. Pregnant women should get a dose of Tdap during every pregnancy, to protect the newborn from pertussis. Infants are most at risk for severe, life-threatening complications from pertussis. Adults who have never received Tdap should get a dose of Tdap. Also, adults should receive a booster dose every 10 years, or earlier in the case of a severe and dirty wound or burn. Booster doses can be either Tdap or Td (a different vaccine that protects against tetanus and diphtheria but not pertussis). Tdap may be given at the same time as other vaccines. 3. Talk with your  health care provider Tell your vaccine provider if the person getting the vaccine:  Has had an allergic reaction after a previous dose of any vaccine that protects against tetanus, diphtheria, or pertussis, or has any severe, life-threatening allergies.  Has had a coma, decreased level of consciousness, or prolonged seizures within 7 days after a previous dose of any pertussis vaccine (DTP, DTaP, or Tdap).  Has seizures or another nervous system problem.  Has ever had Guillain-Barr Syndrome (also called GBS).  Has had severe pain or swelling after a previous dose of any vaccine that protects against tetanus or diphtheria. In some cases, your health care provider may decide to postpone Tdap vaccination to a future visit.  People with minor illnesses, such as a cold, may be vaccinated. People who are moderately or severely ill should usually wait until they recover before getting Tdap vaccine.  Your health care provider can give you more information. 4. Risks of a vaccine reaction  Pain, redness, or swelling where the shot was given, mild fever, headache, feeling tired, and nausea, vomiting, diarrhea, or stomachache sometimes happen after Tdap vaccine. People sometimes faint after medical procedures, including vaccination. Tell your provider if you feel dizzy or have vision changes or ringing in the ears.  As with any medicine, there is a very remote chance of a vaccine causing a severe allergic reaction, other serious injury, or death. 5. What if there is a serious problem? An allergic reaction could occur after the vaccinated person leaves the clinic. If you see signs of a severe allergic reaction (hives, swelling  of the face and throat, difficulty breathing, a fast heartbeat, dizziness, or weakness), call 9-1-1 and get the person to the nearest hospital. For other signs that concern you, call your health care provider.  Adverse reactions should be reported to the Vaccine Adverse Event  Reporting System (VAERS). Your health care provider will usually file this report, or you can do it yourself. Visit the VAERS website at www.vaers.LAgents.no or call 250-195-9427. VAERS is only for reporting reactions, and VAERS staff do not give medical advice. 6. The National Vaccine Injury Compensation Program The [employer] Vaccine Injury Compensation Program (VICP) is a federal program that was created to compensate people who may have been injured by certain vaccines. Visit the VICP website at SpiritualWord.at or call (340)458-5467 to learn about the program and about filing a claim. There is a time limit to file a claim for compensation. 7. How can I learn more?  Ask your health care provider.  Call your local or state health department.  Contact the Centers for Disease Control and Prevention (CDC): ? Call 9057782933 (1-800-CDC-INFO) or ? Visit CDC's website at PicCapture.uy Vaccine Information Statement Tdap (Tetanus, Diphtheria, Pertussis) Vaccine (08/16/2018) This information is not intended to replace advice given to you by your health care provider. Make sure you discuss any questions you have with your health care provider. Document Revised: 08/25/2018 Document Reviewed: 08/28/2018 Elsevier Patient Education  2020 [employer].

## 2020-01-04 NOTE — Progress Notes (Signed)
Subjective:    Patient ID: Bradley Stephenson, male    DOB: 05-07-1971, 48 y.o.   MRN: [id number]  DOS:  01/04/2020 Type of visit - description: CPX Feels well. 3 days ago noted by structure at the left side of the neck, slightly tender when he touches?.  It is not a lump.  Wt Readings from Last 3 Encounters:  01/04/20 178 lb (80.7 kg)  07/06/19 189 lb 2 oz (85.8 kg)  01/09/19 182 lb (82.6 kg)     Review of Systems  Other than above, a 14 point review of systems is negative      Past Medical History:  Diagnosis Date  . Allergy    with allergic asthma -- well controlled; is on injections every 3 weeks  . Hyperlipidemia     Past Surgical History:  Procedure Laterality Date  . EYE SURGERY     Lasic  . KNEE ARTHROSCOPY W/ ACL RECONSTRUCTION Bilateral [id number]   From soccer    Allergies as of 01/04/2020   No Known Allergies     Medication List       Accurate as of January 04, 2020  2:09 PM. If you have any questions, ask your nurse or doctor.        albuterol 108 (90 Base) MCG/ACT inhaler Commonly known as: VENTOLIN HFA Inhale 2 puffs into the lungs every 6 (six) hours as needed for wheezing or shortness of breath.   melatonin 5 MG Tabs Take 1 tablet by mouth daily.          Objective:   Physical Exam Neck:     BP (!) 147/91 (BP Location: Left Arm, Patient Position: Sitting, Cuff Size: Normal)   Pulse 98   Temp 98.1 F (36.7 C) (Oral)   Resp 16   Ht 5\' 10"  (1.778 m)   Wt 178 lb (80.7 kg)   SpO2 97%   BMI 25.54 kg/m  General: Well developed, NAD, BMI noted Neck: No  thyromegaly  HEENT:  Normocephalic . Face symmetric, atraumatic Lungs:  CTA B Normal respiratory effort, no intercostal retractions, no accessory muscle use. Heart: RRR,  no murmur.  Abdomen:  Not distended, soft, non-tender. No rebound or rigidity.   Lower extremities: no pretibial edema bilaterally  Skin: Exposed areas without rash. Not pale. Not jaundice Neurologic:  alert &  oriented X3.  Speech normal, gait appropriate for age and unassisted Strength symmetric and appropriate for age.  Psych: Cognition and judgment appear intact.  Cooperative with normal attention span and concentration.  Behavior appropriate. No anxious or depressed appearing.     Assessment    ASSESSMENT Elevated BP without dx of HTN Hyperlipidemia Asthma  PLAN Here for CPX Elevated BP: BP here is slightly elevated, at home typically 125/70, sometimes when when he is stressed @ work can go to 140s/80s. He has a very healthy lifestyle, continue checking. Hyperlipidemia: Last LDL 188, his 10 year CV RF remains however low at 3.9, he has an excellent lifestyle and no family history, does not smoke.  Recheck  labs.  No fasting, consider recheck in few months fasting if needed Neck: He feels "something" on the left side of the neck for the last 3 days, see physical exam, suspect is his trapezius muscle ; RX: call if something changes or he feels a lump or knot. RTC 1 year  This visit occurred during the SARS-CoV-2 public health emergency.  Safety protocols were in place, including screening questions prior to the visit, additional  usage of staff PPE, and extensive cleaning of exam room while observing appropriate contact time as indicated for disinfecting solutions.

## 2020-01-06 ENCOUNTER — Encounter: Payer: Self-pay | Admitting: Internal Medicine

## 2020-01-06 DIAGNOSIS — Z09 Encounter for follow-up examination after completed treatment for conditions other than malignant neoplasm: Secondary | ICD-10-CM | POA: Insufficient documentation

## 2020-01-06 NOTE — Assessment & Plan Note (Signed)
-  Tdap today  -Had COVID shots -Recommend flu shot q year  -CCS: 3 options discussed but rec IFOB  due to the Covid situation (will take a long time to set up a colonoscopy). - Lifestyle: Doing great, gained weight but he is again very active and eating healthy. - Labs:  CMP, lipid panel (nonfasting), CBC,

## 2020-01-06 NOTE — Assessment & Plan Note (Signed)
Here for CPX Elevated BP: BP here is slightly elevated, at home typically 125/70, sometimes when when he is stressed @ work can go to 140s/80s. He has a very healthy lifestyle, continue checking. Hyperlipidemia: Last LDL 188, his 10 year CV RF remains however low at 3.9, he has an excellent lifestyle and no family history, does not smoke.  Recheck  labs.  No fasting, consider recheck in few months fasting if needed Neck: He feels "something" on the left side of the neck for the last 3 days, see physical exam, suspect is his trapezius muscle ; RX: call if something changes or he feels a lump or knot. RTC 1 year

## 2020-01-08 LAB — COMPREHENSIVE METABOLIC PANEL
AG Ratio: 1.7 (calc) (ref 1.0–2.5)
ALT: 30 U/L (ref 9–46)
AST: 22 U/L (ref 10–40)
Albumin: 4.7 g/dL (ref 3.6–5.1)
Alkaline phosphatase (APISO): 43 U/L (ref 36–130)
BUN/Creatinine Ratio: 15 (calc) (ref 6–22)
BUN: 21 mg/dL (ref 7–25)
CO2: 29 mmol/L (ref 20–32)
Calcium: 9.8 mg/dL (ref 8.6–10.3)
Chloride: 103 mmol/L (ref 98–110)
Creat: 1.39 mg/dL — ABNORMAL HIGH (ref 0.60–1.35)
Globulin: 2.8 g/dL (calc) (ref 1.9–3.7)
Glucose, Bld: 140 mg/dL — ABNORMAL HIGH (ref 65–99)
Potassium: 4.1 mmol/L (ref 3.5–5.3)
Sodium: 140 mmol/L (ref 135–146)
Total Bilirubin: 0.4 mg/dL (ref 0.2–1.2)
Total Protein: 7.5 g/dL (ref 6.1–8.1)

## 2020-01-08 LAB — CBC WITH DIFFERENTIAL/PLATELET
Absolute Monocytes: 523 cells/uL (ref 200–950)
Basophils Absolute: 28 cells/uL (ref 0–200)
Basophils Relative: 0.5 %
Eosinophils Absolute: 72 cells/uL (ref 15–500)
Eosinophils Relative: 1.3 %
HCT: 40.1 % (ref 38.5–50.0)
Hemoglobin: 13.9 g/dL (ref 13.2–17.1)
Lymphs Abs: 2349 cells/uL (ref 850–3900)
MCH: 31.4 pg (ref 27.0–33.0)
MCHC: 34.7 g/dL (ref 32.0–36.0)
MCV: 90.5 fL (ref 80.0–100.0)
MPV: 11 fL (ref 7.5–12.5)
Monocytes Relative: 9.5 %
Neutro Abs: 2530 cells/uL (ref 1500–7800)
Neutrophils Relative %: 46 %
Platelets: 230 10*3/uL (ref 140–400)
RBC: 4.43 10*6/uL (ref 4.20–5.80)
RDW: 12 % (ref 11.0–15.0)
Total Lymphocyte: 42.7 %
WBC: 5.5 10*3/uL (ref 3.8–10.8)

## 2020-01-08 LAB — LIPID PANEL
Cholesterol: 265 mg/dL — ABNORMAL HIGH (ref <200)
HDL: 76 mg/dL (ref >=40)
LDL Cholesterol (Calc): 167 mg/dL (calc) — ABNORMAL HIGH
Non-HDL Cholesterol (Calc): 189 mg/dL (calc) — ABNORMAL HIGH (ref <130)
Total CHOL/HDL Ratio: 3.5 (calc) (ref <5.0)
Triglycerides: 108 mg/dL (ref <150)

## 2020-01-08 LAB — TEST AUTHORIZATION

## 2020-01-08 LAB — HEMOGLOBIN A1C W/OUT EAG: Hgb A1c MFr Bld: 5.3 % of total Hgb (ref <5.7)

## 2020-01-09 ENCOUNTER — Other Ambulatory Visit (INDEPENDENT_AMBULATORY_CARE_PROVIDER_SITE_OTHER): Payer: BC Managed Care – PPO

## 2020-01-09 DIAGNOSIS — Z0000 Encounter for general adult medical examination without abnormal findings: Secondary | ICD-10-CM

## 2020-01-10 LAB — FECAL OCCULT BLOOD, IMMUNOCHEMICAL: Fecal Occult Bld: NEGATIVE

## 2020-01-12 IMAGING — DX DG FOOT COMPLETE 3+V*R*
3 series · 3 of 3 positions shown · non-contrast
Comparison: None.

CLINICAL DATA: Kicked soccer ball with first toe pain, initial
encounter

EXAM:
RIGHT FOOT COMPLETE - 3+ VIEW

[foot ap]
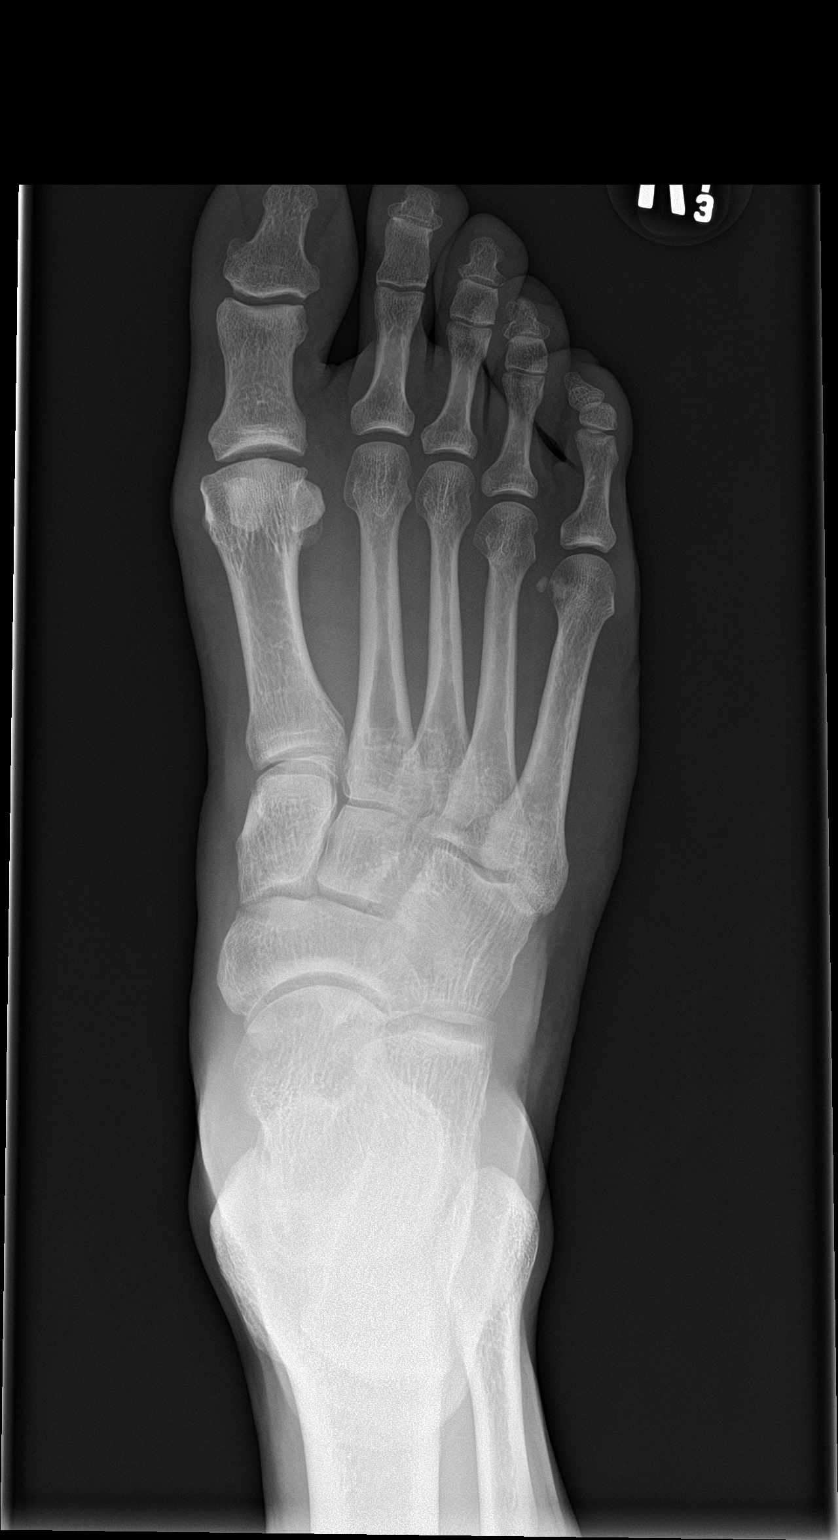

[foot obl]
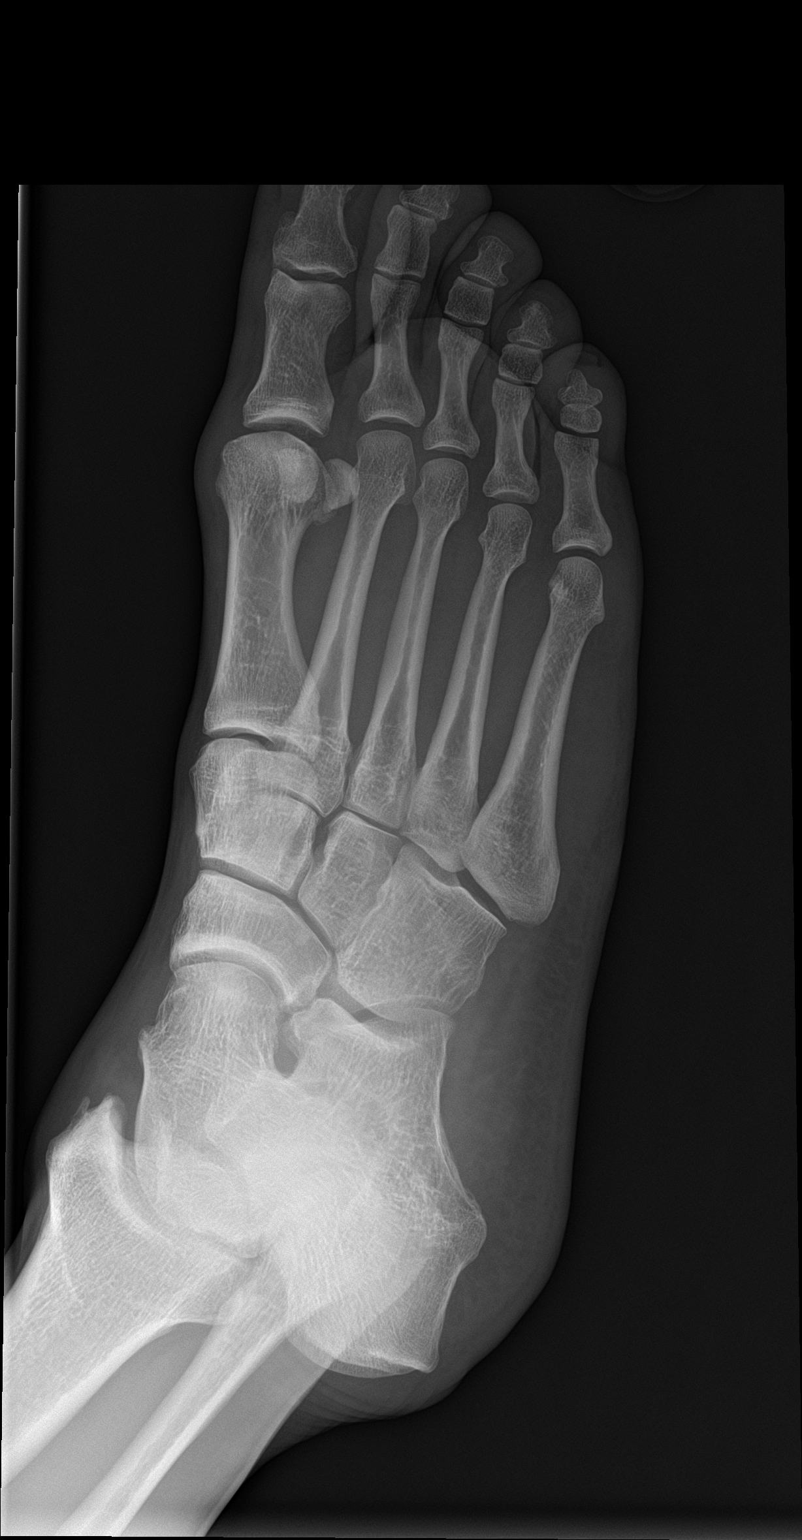

[foot lat]
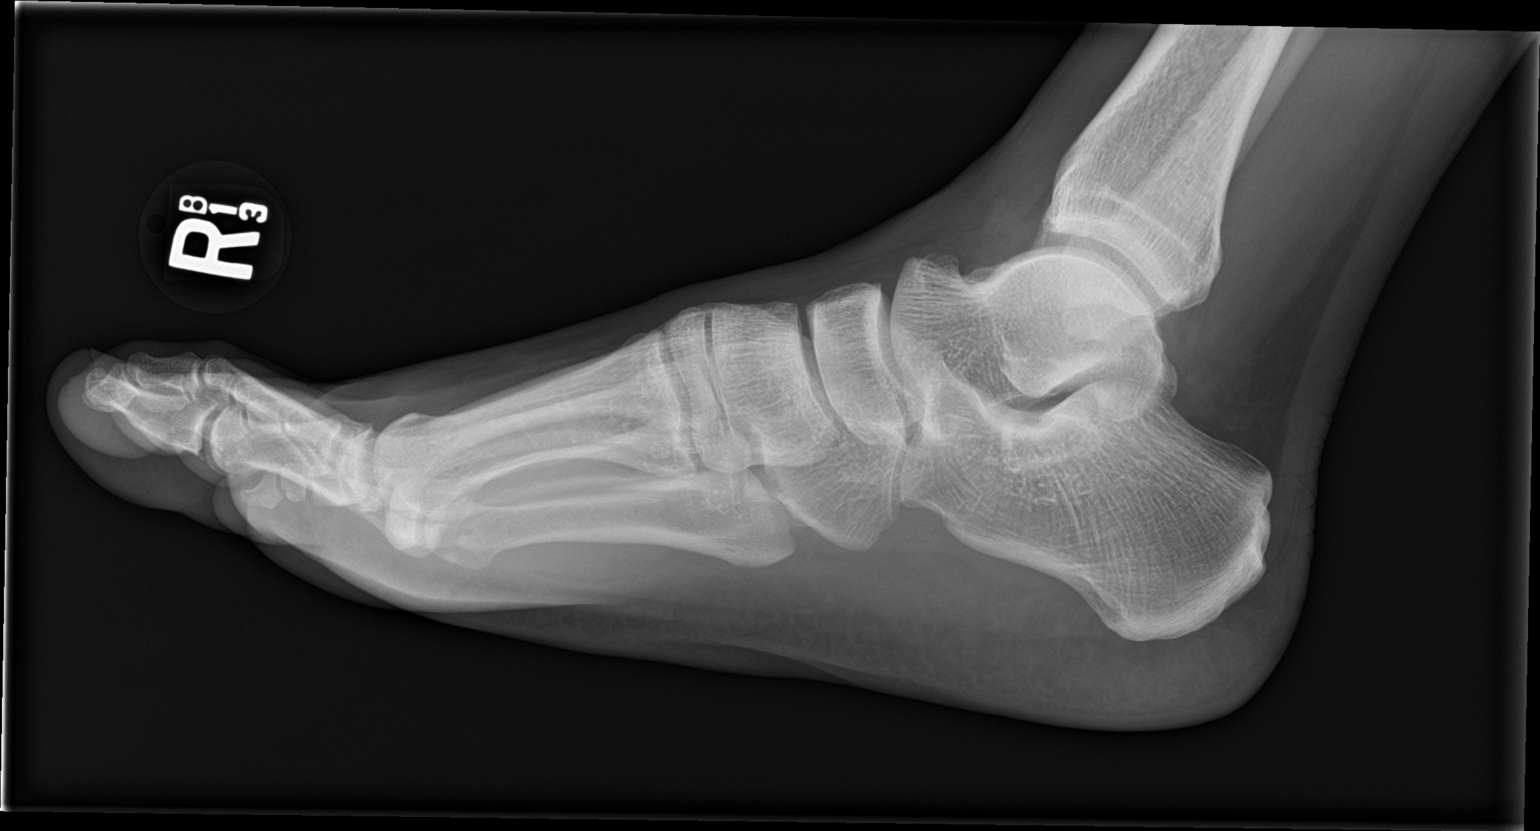

[3 of 3 positions shown; findings below may reference images not displayed]

FINDINGS: Mild degenerative changes of the first MTP joint are noted. No acute
fracture or dislocation is seen. No soft tissue changes are noted.
IMPRESSION: Mild degenerative change without acute abnormality.

## 2020-04-23 DIAGNOSIS — Z03818 Encounter for observation for suspected exposure to other biological agents ruled out: Secondary | ICD-10-CM | POA: Diagnosis not present

## 2020-04-23 DIAGNOSIS — Z20822 Contact with and (suspected) exposure to covid-19: Secondary | ICD-10-CM | POA: Diagnosis not present

## 2020-06-17 HISTORY — PX: ACHILLES TENDON REPAIR: SUR1153

## 2020-06-30 DIAGNOSIS — S99911A Unspecified injury of right ankle, initial encounter: Secondary | ICD-10-CM | POA: Diagnosis not present

## 2020-06-30 DIAGNOSIS — M25571 Pain in right ankle and joints of right foot: Secondary | ICD-10-CM | POA: Diagnosis not present

## 2020-07-02 DIAGNOSIS — S86091A Other specified injury of right Achilles tendon, initial encounter: Secondary | ICD-10-CM | POA: Diagnosis not present

## 2020-07-09 DIAGNOSIS — X58XXXA Exposure to other specified factors, initial encounter: Secondary | ICD-10-CM | POA: Diagnosis not present

## 2020-07-09 DIAGNOSIS — Y9373 Activity, racquet and hand sports: Secondary | ICD-10-CM | POA: Diagnosis not present

## 2020-07-09 DIAGNOSIS — S86011A Strain of right Achilles tendon, initial encounter: Secondary | ICD-10-CM | POA: Diagnosis not present

## 2020-07-09 DIAGNOSIS — G8918 Other acute postprocedural pain: Secondary | ICD-10-CM | POA: Diagnosis not present

## 2020-07-09 DIAGNOSIS — S86091A Other specified injury of right Achilles tendon, initial encounter: Secondary | ICD-10-CM | POA: Diagnosis not present

## 2020-10-15 ENCOUNTER — Ambulatory Visit: Payer: BC Managed Care – PPO | Admitting: Family

## 2020-10-15 ENCOUNTER — Other Ambulatory Visit: Payer: Self-pay

## 2020-10-15 ENCOUNTER — Encounter: Payer: Self-pay | Admitting: Family

## 2020-10-15 VITALS — BP 144/88 | HR 72 | Temp 99.0°F | Resp 16 | Ht 70.0 in | Wt 185.0 lb

## 2020-10-15 DIAGNOSIS — H6692 Otitis media, unspecified, left ear: Secondary | ICD-10-CM | POA: Diagnosis not present

## 2020-10-15 DIAGNOSIS — H60502 Unspecified acute noninfective otitis externa, left ear: Secondary | ICD-10-CM | POA: Diagnosis not present

## 2020-10-15 MED ORDER — NEOMYCIN-POLYMYXIN-HC 3.5-10000-1 OT SOLN: 3.0000 [drp] | Freq: Three times a day (TID) | OTIC | 0 refills | Status: AC

## 2020-10-15 MED ORDER — AMOXICILLIN 500 MG PO CAPS: 500.0000 mg | ORAL_CAPSULE | Freq: Three times a day (TID) | ORAL | 0 refills | Status: AC

## 2020-10-15 NOTE — Assessment & Plan Note (Signed)
New. Will rx with amoxicillin 500mg  tid x 10 days.

## 2020-10-15 NOTE — Patient Instructions (Signed)
Please begin amoxicillin 3x daily and cortisporin drops 3 drops 3x daily to the left ear.

## 2020-10-15 NOTE — Progress Notes (Signed)
Subjective:   By signing my name below, I, Shehryar Baig, attest that this documentation has been prepared under the direction and in the presence of Sandford Craze NP. 10/15/2020     Patient ID: Bradley Stephenson, male    DOB: 1970-10-16, 50 y.o.   MRN: [id number]  Chief Complaint  Patient presents with  . Otalgia    Complains of left ear pain    HPI Patient is in today for a office visit. He complains of left ear pain since yesterday. He was at the beach yesterday. Last night he felt fluid in his left ear after coming back from the beach and found blood after putting a Q-tip inside his left ear.    Past Medical History:  Diagnosis Date  . Allergy    with allergic asthma -- well controlled; is on injections every 3 weeks  . Hyperlipidemia     Past Surgical History:  Procedure Laterality Date  . EYE SURGERY     Lasic  . KNEE ARTHROSCOPY W/ ACL RECONSTRUCTION Bilateral 2008,2011   From soccer    Family History  Problem Relation Age of Onset  . Hypertension Mother   . Hypertension Father   . Diabetes Father        Type II  . Hypertension Brother   . Stroke Paternal Grandfather   . Healthy Son 33  . Colon cancer Neg Hx   . Prostate cancer Neg Hx     Social History   Socioeconomic History  . Marital status: Married    Spouse name: Not on file  . Number of children: 1  . Years of education: Not on file  . Highest education level: Not on file  Occupational History  . Occupation: Electronics engineer: [employer]  Tobacco Use  . Smoking status: Never Smoker  . Smokeless tobacco: Never Used  Substance and Sexual Activity  . Alcohol use: Yes    Alcohol/week: 4.0 standard drinks    Types: 4 Standard drinks or equivalent per week    Comment: Social   . Drug use: No  . Sexual activity: Yes    Partners: Female  Other Topics Concern  . Not on file  Social History Narrative   From [place] , [place]   Active, gym, [employer]   Social Determinants of Health    Financial Resource Strain: Not on file  Food Insecurity: Not on file  Transportation Needs: Not on file  Physical Activity: Not on file  Stress: Not on file  Social Connections: Not on file  Intimate Partner Violence: Not on file    Outpatient Medications Prior to Visit  Medication Sig Dispense Refill  . Melatonin 5 MG TABS Take 1 tablet by mouth daily.    Marland Kitchen albuterol (VENTOLIN HFA) 108 (90 Base) MCG/ACT inhaler Inhale 2 puffs into the lungs every 6 (six) hours as needed for wheezing or shortness of breath. (Patient not taking: Reported on 01/04/2020) 18 g 1   No facility-administered medications prior to visit.    No Known Allergies  Review of Systems  HENT: Positive for ear pain.        Objective:    Physical Exam Constitutional:      Appearance: Normal appearance.  HENT:     Right Ear: External ear normal. Tympanic membrane is not erythematous.     Left Ear: Tympanic membrane is erythematous.     Ears:     Comments: erythema in left ear canals without exudate. Eyes:  Extraocular Movements: Extraocular movements intact.     Pupils: Pupils are equal, round, and reactive to light.  Skin:    General: Skin is warm and dry.  Neurological:     Mental Status: He is alert and oriented to person, place, and time.  Psychiatric:        Behavior: Behavior normal.     BP (!) 144/88 (BP Location: Right Arm, Patient Position: Sitting, Cuff Size: Small)   Pulse 72   Temp 99 F (37.2 C) (Oral)   Resp 16   Ht 5\' 10"  (1.778 m)   Wt 185 lb (83.9 kg)   SpO2 99%   BMI 26.54 kg/m  Wt Readings from Last 3 Encounters:  10/15/20 185 lb (83.9 kg)  01/04/20 178 lb (80.7 kg)  07/06/19 189 lb 2 oz (85.8 kg)    Diabetic Foot Exam - Simple   No data filed    Lab Results  Component Value Date   WBC 5.5 01/04/2020   HGB 13.9 01/04/2020   HCT 40.1 01/04/2020   PLT 230 01/04/2020   GLUCOSE 140 (H) 01/04/2020   CHOL 265 (H) 01/04/2020   TRIG 108 01/04/2020   HDL 76  01/04/2020   LDLCALC 167 (H) 01/04/2020   ALT 30 01/04/2020   AST 22 01/04/2020   NA 140 01/04/2020   K 4.1 01/04/2020   CL 103 01/04/2020   CREATININE 1.39 (H) 01/04/2020   BUN 21 01/04/2020   CO2 29 01/04/2020   TSH 1.30 01/10/2019   HGBA1C 5.3 01/04/2020    Lab Results  Component Value Date   TSH 1.30 01/10/2019   Lab Results  Component Value Date   WBC 5.5 01/04/2020   HGB 13.9 01/04/2020   HCT 40.1 01/04/2020   MCV 90.5 01/04/2020   PLT 230 01/04/2020   Lab Results  Component Value Date   NA 140 01/04/2020   K 4.1 01/04/2020   CO2 29 01/04/2020   GLUCOSE 140 (H) 01/04/2020   BUN 21 01/04/2020   CREATININE 1.39 (H) 01/04/2020   BILITOT 0.4 01/04/2020   ALKPHOS 32 (L) 01/10/2019   AST 22 01/04/2020   ALT 30 01/04/2020   PROT 7.5 01/04/2020   ALBUMIN 4.5 01/10/2019   CALCIUM 9.8 01/04/2020   GFR 63.15 01/10/2019   Lab Results  Component Value Date   CHOL 265 (H) 01/04/2020   Lab Results  Component Value Date   HDL 76 01/04/2020   Lab Results  Component Value Date   LDLCALC 167 (H) 01/04/2020   Lab Results  Component Value Date   TRIG 108 01/04/2020   Lab Results  Component Value Date   CHOLHDL 3.5 01/04/2020   Lab Results  Component Value Date   HGBA1C 5.3 01/04/2020       Assessment & Plan:   Problem List Items Addressed This Visit      Unprioritized   Left otitis media    New. Will rx with amoxicillin 500mg  tid x 10 days.       Relevant Medications   amoxicillin (AMOXIL) 500 MG capsule   Acute otitis externa of left ear - Primary    New. Will Rx cortisporin otic drops TID x 7 days.           Meds ordered this encounter  Medications  . amoxicillin (AMOXIL) 500 MG capsule    Sig: Take 1 capsule (500 mg total) by mouth 3 (three) times daily for 10 days.    Dispense:  30 capsule  Refill:  0    Order Specific Question:   Supervising Provider    Answer:   Danise Edge A [[id number]]  . neomycin-polymyxin-hydrocortisone  (CORTISPORIN) OTIC solution    Sig: Place 3 drops into the left ear 3 (three) times daily for 7 days.    Dispense:  10 mL    Refill:  0    Order Specific Question:   Supervising Provider    Answer:   Danise Edge A [[id number]]    I, Sandford Craze NP, personally preformed the services described in this documentation.  All medical record entries made by the scribe were at my direction and in my presence.  I have reviewed the chart and discharge instructions (if applicable) and agree that the record reflects my personal performance and is accurate and complete. 10/15/2020   I,Shehryar Baig,acting as a Neurosurgeon for Lemont Fillers, NP.,have documented all relevant documentation on the behalf of Lemont Fillers, NP,as directed by  Lemont Fillers, NP while in the presence of Lemont Fillers, NP.   Lemont Fillers, NP

## 2020-10-15 NOTE — Assessment & Plan Note (Signed)
New. Will Rx cortisporin otic drops TID x 7 days.

## 2020-11-24 DIAGNOSIS — M25571 Pain in right ankle and joints of right foot: Secondary | ICD-10-CM | POA: Diagnosis not present

## 2021-01-05 ENCOUNTER — Other Ambulatory Visit: Payer: Self-pay

## 2021-01-05 ENCOUNTER — Encounter: Payer: Self-pay | Admitting: Internal Medicine

## 2021-01-05 ENCOUNTER — Ambulatory Visit (INDEPENDENT_AMBULATORY_CARE_PROVIDER_SITE_OTHER): Payer: BC Managed Care – PPO | Admitting: Internal Medicine

## 2021-01-05 VITALS — BP 144/92 | HR 63 | Temp 98.0°F | Resp 16 | Ht 70.0 in | Wt 167.1 lb

## 2021-01-05 DIAGNOSIS — Z0000 Encounter for general adult medical examination without abnormal findings: Secondary | ICD-10-CM

## 2021-01-05 DIAGNOSIS — Z1159 Encounter for screening for other viral diseases: Secondary | ICD-10-CM | POA: Diagnosis not present

## 2021-01-05 DIAGNOSIS — R030 Elevated blood-pressure reading, without diagnosis of hypertension: Secondary | ICD-10-CM

## 2021-01-05 NOTE — Patient Instructions (Signed)
Check the  blood pressure  3-4 times a week  BP GOAL is between 110/65 and  135/85. If it is consistently higher or lower, let me know    HOW TO TAKE YOUR BLOOD PRESSURE:   Rest 5 minutes before taking your blood pressure.   Don't smoke or drink caffeinated beverages for at least 30 minutes before.   Take your blood pressure before (not after) you eat.   Sit comfortably with your back supported and both feet on the floor (don't cross your legs).   Elevate your arm to heart level on a table or a desk.   Use the proper sized cuff. It should fit smoothly and snugly around your bare upper arm. There should be enough room to slip a fingertip under the cuff. The bottom edge of the cuff should be 1 inch above the crease of the elbow.   Ideally, take 3 measurements at one sitting and record the average.    GO TO THE LAB : Get the blood work     GO TO THE FRONT DESK, PLEASE SCHEDULE YOUR APPOINTMENTS Come back for     Come back for     STOP BY THE FIRST FLOOR:  get the XR

## 2021-01-05 NOTE — Progress Notes (Signed)
Subjective:    Patient ID: Bradley Stephenson, male    DOB: 05-19-1970, 50 y.o.   MRN: [id number]  DOS:  01/05/2021 Type of visit - description: CPX Since the last office visit he is doing well, did have surgery on the Achilles tendon earlier this year, had a rupture, very good results from the surgery.  He continues to be active. Denies chest pain or difficulty breathing.  Review of Systems  Other than above, a 14 point review of systems is negative     Past Medical History:  Diagnosis Date   Allergy    with allergic asthma -- well controlled; is on injections every 3 weeks   Hyperlipidemia     Past Surgical History:  Procedure Laterality Date   ACHILLES TENDON REPAIR Right 06/2020   EYE SURGERY     Lasic   KNEE ARTHROSCOPY W/ ACL RECONSTRUCTION Bilateral 2008,2011   From soccer   Social History   Socioeconomic History   Marital status: Married    Spouse name: Not on file   Number of children: 1   Years of education: Not on file   Highest education level: Not on file  Occupational History   Occupation: Electronics engineer: [employer]  Tobacco Use   Smoking status: Never   Smokeless tobacco: Never  Substance and Sexual Activity   Alcohol use: Yes    Alcohol/week: 4.0 standard drinks    Types: 4 Standard drinks or equivalent per week    Comment: Social    Drug use: No   Sexual activity: Yes    Partners: Female  Other Topics Concern   Not on file  Social History Narrative   From [place] , [place]   Active, gym, [employer]   Son started college, 2022   Social Determinants of Health   Financial Resource Strain: Not on file  Food Insecurity: Not on file  Transportation Needs: Not on file  Physical Activity: Not on file  Stress: Not on file  Social Connections: Not on file  Intimate Partner Violence: Not on file    Allergies as of 01/05/2021   No Known Allergies      Medication List        Accurate as of January 05, 2021 11:59 PM. If you have any  questions, ask your nurse or doctor.          aspirin EC 81 MG tablet Take 81 mg by mouth daily. Swallow whole.   melatonin 5 MG Tabs Take 1 tablet by mouth daily.           Objective:   Physical Exam BP (!) 144/92 (BP Location: Left Arm, Patient Position: Sitting, Cuff Size: Small)   Pulse 63   Temp 98 F (36.7 C) (Oral)   Resp 16   Ht 5\' 10"  (1.778 m)   Wt 167 lb 2 oz (75.8 kg)   SpO2 97%   BMI 23.98 kg/m  General: Well developed, NAD, BMI noted Neck: No  thyromegaly  HEENT:  Normocephalic . Face symmetric, atraumatic Lungs:  CTA B Normal respiratory effort, no intercostal retractions, no accessory muscle use. Heart: RRR,  no murmur.  Abdomen:  Not distended, soft, non-tender. No rebound or rigidity.   Lower extremities: no pretibial edema bilaterally  Skin: Exposed areas without rash. Not pale. Not jaundice Neurologic:  alert & oriented X3.  Speech normal, gait appropriate for age and unassisted Strength symmetric and appropriate for age.  Psych: Cognition and judgment appear intact.  Cooperative with normal attention span and concentration.  Behavior appropriate. No anxious or depressed appearing.     Assessment      ASSESSMENT Elevated BP without dx of HTN Hyperlipidemia Asthma  PLAN Here for CPX Elevated BP: BP elevated today when he arrived, I recheck: 160/95, the patient checks at home and it is consistently well within normal. BP was checked at work few days ago and when he had surgery and BPs were normal. Suspect white coat hypertension. Plan: Continue healthy lifestyle, check BPs, see his new doctor in few months for a BP check (he is moving to [place]). Hyperlipidemia: Diet controlled, LDL is a slightly elevated but CBR F based on last FLP is 3.3% Asthma: Not an issue at this point. Social: Moving to [place] in 2 to 3 weeks. RTC as needed   This visit occurred during the SARS-CoV-2 public health emergency.  Safety protocols were in  place, including screening questions prior to the visit, additional usage of staff PPE, and extensive cleaning of exam room while observing appropriate contact time as indicated for disinfecting solutions.

## 2021-01-06 ENCOUNTER — Encounter: Payer: Self-pay | Admitting: Internal Medicine

## 2021-01-06 DIAGNOSIS — R030 Elevated blood-pressure reading, without diagnosis of hypertension: Secondary | ICD-10-CM | POA: Insufficient documentation

## 2021-01-06 DIAGNOSIS — I10 Essential (primary) hypertension: Secondary | ICD-10-CM | POA: Insufficient documentation

## 2021-01-06 LAB — COMPREHENSIVE METABOLIC PANEL
ALT: 66 U/L — ABNORMAL HIGH (ref 0–53)
AST: 51 U/L — ABNORMAL HIGH (ref 0–37)
Albumin: 4.5 g/dL (ref 3.5–5.2)
Alkaline Phosphatase: 40 U/L (ref 39–117)
BUN: 13 mg/dL (ref 6–23)
CO2: 28 mEq/L (ref 19–32)
Calcium: 10 mg/dL (ref 8.4–10.5)
Chloride: 102 mEq/L (ref 96–112)
Creatinine, Ser: 1.16 mg/dL (ref 0.40–1.50)
GFR: 74.37 mL/min (ref >60.00)
Glucose, Bld: 89 mg/dL (ref 70–99)
Potassium: 4.5 mEq/L (ref 3.5–5.1)
Sodium: 140 mEq/L (ref 135–145)
Total Bilirubin: 0.5 mg/dL (ref 0.2–1.2)
Total Protein: 7.5 g/dL (ref 6.0–8.3)

## 2021-01-06 LAB — CBC WITH DIFFERENTIAL/PLATELET
Basophils Absolute: 0 10*3/uL (ref 0.0–0.1)
Basophils Relative: 1.1 % (ref 0.0–3.0)
Eosinophils Absolute: 0 10*3/uL (ref 0.0–0.7)
Eosinophils Relative: 0.8 % (ref 0.0–5.0)
HCT: 40.5 % (ref 39.0–52.0)
Hemoglobin: 13.6 g/dL (ref 13.0–17.0)
Lymphocytes Relative: 41.3 % (ref 12.0–46.0)
Lymphs Abs: 1.6 10*3/uL (ref 0.7–4.0)
MCHC: 33.5 g/dL (ref 30.0–36.0)
MCV: 90.3 fl (ref 78.0–100.0)
Monocytes Absolute: 0.4 10*3/uL (ref 0.1–1.0)
Monocytes Relative: 10.4 % (ref 3.0–12.0)
Neutro Abs: 1.8 10*3/uL (ref 1.4–7.7)
Neutrophils Relative %: 46.4 % (ref 43.0–77.0)
Platelets: 241 10*3/uL (ref 150.0–400.0)
RBC: 4.48 Mil/uL (ref 4.22–5.81)
RDW: 13.5 % (ref 11.5–15.5)
WBC: 3.9 10*3/uL — ABNORMAL LOW (ref 4.0–10.5)

## 2021-01-06 LAB — TSH: TSH: 0.96 u[IU]/mL (ref 0.35–5.50)

## 2021-01-06 LAB — LIPID PANEL
Cholesterol: 234 mg/dL — ABNORMAL HIGH (ref 0–200)
HDL: 78.1 mg/dL (ref >39.00)
LDL Cholesterol: 137 mg/dL — ABNORMAL HIGH (ref 0–99)
NonHDL: 155.51
Total CHOL/HDL Ratio: 3
Triglycerides: 93 mg/dL (ref 0.0–149.0)
VLDL: 18.6 mg/dL (ref 0.0–40.0)

## 2021-01-06 LAB — HEMOGLOBIN A1C: Hgb A1c MFr Bld: 5.7 % (ref 4.6–6.5)

## 2021-01-06 LAB — HEPATITIS C ANTIBODY
Hepatitis C Ab: NONREACTIVE
SIGNAL TO CUT-OFF: 0.01 (ref <1.00)

## 2021-01-06 NOTE — Assessment & Plan Note (Signed)
Here for CPX Elevated BP: BP elevated today when he arrived, I recheck: 160/95, the patient checks at home and it is consistently well within normal. BP was checked at work few days ago and when he had surgery and BPs were normal. Suspect white coat hypertension. Plan: Continue healthy lifestyle, check BPs, see his new doctor in few months for a BP check (he is moving to [place]). Hyperlipidemia: Diet controlled, LDL is a slightly elevated but CBR F based on last FLP is 3.3% Asthma: Not an issue at this point. Social: Moving to [place] in 2 to 3 weeks. RTC as needed 

## 2021-01-06 NOTE — Assessment & Plan Note (Signed)
Here for CPX Elevated BP: BP elevated today when he arrived, I recheck: 160/95, the patient checks at home and it is consistently well within normal. BP was checked at work few days ago and when he had surgery and BPs were normal. Suspect white coat hypertension. Plan: Continue healthy lifestyle, check BPs, see his new doctor in few months for a BP check (he is moving to [place]). Hyperlipidemia: Diet controlled, LDL is a slightly elevated but CBR F based on last FLP is 3.3% Asthma: Not an issue at this point. Social: Moving to [place] in 2 to 3 weeks. RTC as needed

## 2021-01-07 ENCOUNTER — Other Ambulatory Visit (INDEPENDENT_AMBULATORY_CARE_PROVIDER_SITE_OTHER): Payer: BC Managed Care – PPO

## 2021-01-07 DIAGNOSIS — Z0000 Encounter for general adult medical examination without abnormal findings: Secondary | ICD-10-CM

## 2021-01-07 LAB — FECAL OCCULT BLOOD, IMMUNOCHEMICAL: Fecal Occult Bld: NEGATIVE

## 2021-03-06 ENCOUNTER — Encounter: Payer: Self-pay | Admitting: Internal Medicine

## 2021-03-06 DIAGNOSIS — Z3009 Encounter for other general counseling and advice on contraception: Secondary | ICD-10-CM

## 2023-10-04 ENCOUNTER — Ambulatory Visit: Admitting: Internal Medicine

## 2023-10-04 ENCOUNTER — Encounter: Payer: Self-pay | Admitting: Internal Medicine

## 2023-10-04 VITALS — BP 126/80 | HR 77 | Temp 97.7°F | Resp 16 | Ht 70.0 in | Wt 175.5 lb

## 2023-10-04 DIAGNOSIS — R030 Elevated blood-pressure reading, without diagnosis of hypertension: Secondary | ICD-10-CM

## 2023-10-04 DIAGNOSIS — Z125 Encounter for screening for malignant neoplasm of prostate: Secondary | ICD-10-CM

## 2023-10-04 DIAGNOSIS — Z1211 Encounter for screening for malignant neoplasm of colon: Secondary | ICD-10-CM

## 2023-10-04 NOTE — Progress Notes (Signed)
   Subjective:    Patient ID: Bradley Stephenson, male    DOB: 12/28/1970, 53 y.o.   MRN: [id number]  DOS:  10/04/2023 Type of visit - description: To reestablish, LOV 2022  The patient was living abroad, back in [place] since 05-2023. Requests a colonoscopy. Denies fever or chills. No nausea or vomiting. No diarrhea or blood in the stools.  History of elevated BP, he has been checked regularly and all ambulatory BPs normal.  Denies any LUTS  Review of Systems See above   Past Medical History:  Diagnosis Date   Allergy    with allergic asthma -- well controlled; is on injections every 3 weeks   Hyperlipidemia     Past Surgical History:  Procedure Laterality Date   ACHILLES TENDON REPAIR Right 06/2020   EYE SURGERY     Lasic   KNEE ARTHROSCOPY W/ ACL RECONSTRUCTION Bilateral 2008,2011   From soccer    Current Outpatient Medications  Medication Instructions   aspirin EC 81 mg, Daily       Objective:   Physical Exam BP 126/80   Pulse 77   Temp 97.7 F (36.5 C) (Oral)   Resp 16   Ht 5\' 10"  (1.778 m)   Wt 175 lb 8 oz (79.6 kg)   SpO2 97%   BMI 25.18 kg/m  General:   Well developed, NAD, BMI noted.  HEENT:  Normocephalic . Face symmetric, atraumatic Lungs:  CTA B Normal respiratory effort, no intercostal retractions, no accessory muscle use. Heart: RRR,  no murmur.  Abdomen:  Not distended, soft, non-tender. No rebound or rigidity. DRE: Brown stools, normal sphincter tone.  Normal prostate Skin: Not pale. Not jaundice Lower extremities: no pretibial edema bilaterally  Neurologic:  alert & oriented X3.  Speech normal, gait appropriate for age and unassisted Psych--  Cognition and judgment appear intact.  Cooperative with normal attention span and concentration.  Behavior appropriate. No anxious or depressed appearing.     Assessment    ASSESSMENT--established 2025 Elevated BP without dx of HTN Hyperlipidemia Asthma   PLAN Elevated BP without a  diagnosis of hypertension: Reports normal ambulatory BPs, check a CMP, CBC.  Continue with his very healthy lifestyle Hyperlipidemia: Lifestyle controlled, recheck on RTC Allergies: Plans to see his allergist for injections. Preventive care: His mother had a partial colectomy last year, she is in her mid 25s, unclear to the patient if she had cancer or a large polyp or something else.  Has no GI symptoms, requested colonoscopy, referral sent. Prostate cancer screening: No symptoms, check PSA. Social: He is now divorced, lived in [place] from 2022 to 05/2023.  He is back in town and feeling well. RTC CPX 4 to 5 months

## 2023-10-04 NOTE — Patient Instructions (Addendum)
 To schedule a colonoscopy, please call the gastroenterology office. [phone]  In the checking your blood pressure to time. Blood pressure goal:  between 110/65 and  135/85. If it is consistently higher or lower, let me know     GO TO THE LAB :  Get the blood work   Your results will be posted on MyChart with my comments  Next office visit for a physical exam in 4 to 5 months Please make an appointment before you leave today

## 2023-10-05 ENCOUNTER — Encounter: Payer: Self-pay | Admitting: Internal Medicine

## 2023-10-05 LAB — COMPREHENSIVE METABOLIC PANEL WITH GFR
ALT: 19 U/L (ref 0–53)
AST: 34 U/L (ref 0–37)
Albumin: 4.7 g/dL (ref 3.5–5.2)
Alkaline Phosphatase: 37 U/L — ABNORMAL LOW (ref 39–117)
BUN: 14 mg/dL (ref 6–23)
CO2: 31 meq/L (ref 19–32)
Calcium: 9.8 mg/dL (ref 8.4–10.5)
Chloride: 100 meq/L (ref 96–112)
Creatinine, Ser: 1.05 mg/dL (ref 0.40–1.50)
GFR: 82.22 mL/min (ref >60.00)
Glucose, Bld: 105 mg/dL — ABNORMAL HIGH (ref 70–99)
Potassium: 4.1 meq/L (ref 3.5–5.1)
Sodium: 138 meq/L (ref 135–145)
Total Bilirubin: 0.6 mg/dL (ref 0.2–1.2)
Total Protein: 7.6 g/dL (ref 6.0–8.3)

## 2023-10-05 LAB — CBC WITH DIFFERENTIAL/PLATELET
Basophils Absolute: 0 10*3/uL (ref 0.0–0.1)
Basophils Relative: 0.8 % (ref 0.0–3.0)
Eosinophils Absolute: 0 10*3/uL (ref 0.0–0.7)
Eosinophils Relative: 0.8 % (ref 0.0–5.0)
HCT: 43 % (ref 39.0–52.0)
Hemoglobin: 14.2 g/dL (ref 13.0–17.0)
Lymphocytes Relative: 34.3 % (ref 12.0–46.0)
Lymphs Abs: 1.6 10*3/uL (ref 0.7–4.0)
MCHC: 33 g/dL (ref 30.0–36.0)
MCV: 90.2 fl (ref 78.0–100.0)
Monocytes Absolute: 0.5 10*3/uL (ref 0.1–1.0)
Monocytes Relative: 9.9 % (ref 3.0–12.0)
Neutro Abs: 2.5 10*3/uL (ref 1.4–7.7)
Neutrophils Relative %: 54.2 % (ref 43.0–77.0)
Platelets: 241 10*3/uL (ref 150.0–400.0)
RBC: 4.77 Mil/uL (ref 4.22–5.81)
RDW: 13.6 % (ref 11.5–15.5)
WBC: 4.6 10*3/uL (ref 4.0–10.5)

## 2023-10-05 LAB — PSA: PSA: 0.26 ng/mL (ref 0.10–4.00)

## 2023-10-05 NOTE — Assessment & Plan Note (Signed)
 Elevated BP without a diagnosis of hypertension: Reports normal ambulatory BPs, check a CMP, CBC.  Continue with his very healthy lifestyle Hyperlipidemia: Lifestyle controlled, recheck on RTC Allergies: Plans to see his allergist for injections. Preventive care: His mother had a partial colectomy last year, she is in her mid 66s, unclear to the patient if she had cancer or a large polyp or something else.  Has no GI symptoms, requested colonoscopy, referral sent. Prostate cancer screening: No symptoms, check PSA. Social: He is now divorced, lived in [place] from 2022 to 05/2023.  He is back in town and feeling well. RTC CPX 4 to 5 months

## 2023-10-07 ENCOUNTER — Ambulatory Visit: Payer: Self-pay | Admitting: Internal Medicine

## 2023-11-10 ENCOUNTER — Encounter: Payer: Self-pay | Admitting: Pediatrics

## 2023-12-05 ENCOUNTER — Encounter: Payer: Self-pay | Admitting: Pediatrics

## 2023-12-05 ENCOUNTER — Telehealth: Payer: Self-pay | Admitting: Pediatrics

## 2023-12-05 ENCOUNTER — Other Ambulatory Visit: Payer: Self-pay | Admitting: Medical Genetics

## 2023-12-05 ENCOUNTER — Ambulatory Visit (AMBULATORY_SURGERY_CENTER)

## 2023-12-05 VITALS — Ht 71.0 in | Wt 161.0 lb

## 2023-12-05 DIAGNOSIS — Z1211 Encounter for screening for malignant neoplasm of colon: Secondary | ICD-10-CM

## 2023-12-05 MED ORDER — NA SULFATE-K SULFATE-MG SULF 17.5-3.13-1.6 GM/177ML PO SOLN: 1.0000 | Freq: Once | ORAL | 0 refills | Status: AC

## 2023-12-05 NOTE — Telephone Encounter (Signed)
Reached patient and completed pre visit

## 2023-12-05 NOTE — Progress Notes (Signed)
 No egg or soy allergy known to patient  No issues known to pt with past sedation with any surgeries or procedures Patient denies ever being told they had issues or difficulty with intubation  No FH of Malignant Hyperthermia Pt is not on diet pills Pt is not on  home 02  Pt is not on blood thinners  Pt denies issues with constipation  No A fib or A flutter Have any cardiac testing pending--no Pt can ambulate independently Pt denies use of chewing tobacco Discussed diabetic I weight loss medication holds Discussed NSAID holds Checked BMI Pt instructed to use Singlecare.com or GoodRx for a price reduction on prep  Patient's chart reviewed by Bradley Stephenson CNRA prior to previsit and patient appropriate for the [facility].  Pre visit completed and red dot placed by patient's name on their procedure day (on provider's schedule).

## 2023-12-05 NOTE — Telephone Encounter (Signed)
 Patient called and stated that he has not received a phone call from the nurse regarding his pre-visit. Patient is requesting a call back. Please advise.

## 2023-12-13 ENCOUNTER — Other Ambulatory Visit (HOSPITAL_COMMUNITY)
Admission: RE | Admit: 2023-12-13 | Discharge: 2023-12-13 | Disposition: A | Discharge status: Home / Self Care | Payer: Self-pay | Source: Ambulatory Visit | Attending: Oncology | Admitting: Oncology

## 2023-12-19 ENCOUNTER — Encounter: Admitting: Pediatrics

## 2023-12-24 LAB — GENECONNECT MOLECULAR SCREEN: Genetic Analysis Overall Interpretation: NEGATIVE

## 2024-01-23 NOTE — Progress Notes (Unsigned)
 [facility] History and Physical   Primary Care Physician:  Amon Aloysius BRAVO, MD   Reason for Procedure:  Colorectal cancer screening  Plan:    Screening colonoscopy     HPI: Bradley Stephenson is a 53 y.o. male undergoing screening colonoscopy for colorectal cancer screening.  This is the patient's first colonoscopy.  Records indicate that the patient's mother may have had colon polyps or possibly a form of colon cancer for which she underwent partial colon resection.  Patient denies change in bowel habits or rectal bleeding at the time of today's exam.   Past Medical History:  Diagnosis Date   Allergy    with allergic asthma -- well controlled; is on injections every 3 weeks   Hyperlipidemia     Past Surgical History:  Procedure Laterality Date   ACHILLES TENDON REPAIR Right 06/2020   EYE SURGERY     Lasic   KNEE ARTHROSCOPY W/ ACL RECONSTRUCTION Bilateral 7991,7988   From soccer    Prior to Admission medications   Medication Sig Start Date End Date Taking? Authorizing Provider  aspirin EC 81 MG tablet Take 81 mg by mouth daily. Swallow whole.    [provider]    Current Outpatient Medications  Medication Sig Dispense Refill   aspirin EC 81 MG tablet Take 81 mg by mouth daily. Swallow whole.     No current facility-administered medications for this visit.    Allergies as of 01/24/2024 - Review Complete 12/05/2023  Allergen Reaction Noted   Molds & smuts Itching 12/05/2023   Pollen extract Itching 12/05/2023    Family History  Problem Relation Age of Onset   Hypertension Mother    Colon polyps Mother        had a partial colectomy, age 30, cancer?   Hypertension Father    Diabetes Father        Type II   Hypertension Brother    Stroke Paternal Grandfather    Healthy Son 13   Prostate cancer Neg Hx    Colon cancer Neg Hx    Esophageal cancer Neg Hx    Rectal cancer Neg Hx    Stomach cancer Neg Hx     Social History   Socioeconomic  History   Marital status: Divorced    Spouse name: Not on file   Number of children: 1   Years of education: Not on file   Highest education level: Not on file  Occupational History   Occupation: Electronics engineer: [employer]  Tobacco Use   Smoking status: Never   Smokeless tobacco: Never  Vaping Use   Vaping status: Never Used  Substance and Sexual Activity   Alcohol use: Yes    Alcohol/week: 4.0 standard drinks of alcohol    Types: 4 Standard drinks or equivalent per week    Comment: Social    Drug use: No   Sexual activity: Yes    Partners: Female  Other Topics Concern   Not on file  Social History Narrative   Divorced   From [place] , [place]   Active, gym, [employer]   Son started college, 2022   Social Drivers of Health   Financial Resource Strain: Not on file  Food Insecurity: Not on file  Transportation Needs: Not on file  Physical Activity: Not on file  Stress: Not on file  Social Connections: Not on file  Intimate Partner Violence: Not on file    Review of Systems:  All other review of  systems negative except as mentioned in the HPI.  Physical Exam: Vital signs There were no vitals taken for this visit.  General:   Alert,  Well-developed, well-nourished, pleasant and cooperative in NAD Airway:  Mallampati  Lungs:  Clear throughout to auscultation.   Heart:  Regular rate and rhythm; no murmurs, clicks, rubs,  or gallops. Abdomen:  Soft, nontender and nondistended. Normal bowel sounds.   Neuro/Psych:  Normal mood and affect. A and O x 3  Inocente Hausen, MD [facility]

## 2024-01-24 ENCOUNTER — Encounter: Payer: Self-pay | Admitting: Pediatrics

## 2024-01-24 ENCOUNTER — Ambulatory Visit: Admitting: Pediatrics

## 2024-01-24 VITALS — BP 106/68 | HR 65 | Temp 98.0°F | Resp 14 | Ht 70.0 in | Wt 175.0 lb

## 2024-01-24 DIAGNOSIS — Z1211 Encounter for screening for malignant neoplasm of colon: Secondary | ICD-10-CM

## 2024-01-24 DIAGNOSIS — K644 Residual hemorrhoidal skin tags: Secondary | ICD-10-CM | POA: Diagnosis not present

## 2024-01-24 DIAGNOSIS — K648 Other hemorrhoids: Secondary | ICD-10-CM

## 2024-01-24 MED ORDER — SODIUM CHLORIDE 0.9 % IV SOLN: 500.0000 mL | Freq: Once | INTRAVENOUS | Status: DC

## 2024-01-24 NOTE — Progress Notes (Signed)
 Pt's states no medical or surgical changes since previsit or office visit.

## 2024-01-24 NOTE — Patient Instructions (Signed)
 Resume previous diet. Continue present medications. Repeat colonoscopy in 10 years for screening purposes. Handouts provided on hemorrhoids.  YOU HAD AN ENDOSCOPIC PROCEDURE TODAY AT THE [facility]:   Refer to the procedure report that was given to you for any specific questions about what was found during the examination.  If the procedure report does not answer your questions, please call your gastroenterologist to clarify.  If you requested that your care partner not be given the details of your procedure findings, then the procedure report has been included in a sealed envelope for you to review at your convenience later.  YOU SHOULD EXPECT: Some feelings of bloating in the abdomen. Passage of more gas than usual.  Walking can help get rid of the air that was put into your GI tract during the procedure and reduce the bloating. If you had a lower endoscopy (such as a colonoscopy or flexible sigmoidoscopy) you may notice spotting of blood in your stool or on the toilet paper. If you underwent a bowel prep for your procedure, you may not have a normal bowel movement for a few days.  Please Note:  You might notice some irritation and congestion in your nose or some drainage.  This is from the oxygen used during your procedure.  There is no need for concern and it should clear up in a day or so.  SYMPTOMS TO REPORT IMMEDIATELY:  Following lower endoscopy (colonoscopy or flexible sigmoidoscopy):  Excessive amounts of blood in the stool  Significant tenderness or worsening of abdominal pains  Swelling of the abdomen that is new, acute  Fever of 100F or higher  For urgent or emergent issues, a gastroenterologist can be reached at any hour by calling [phone]. Do not use MyChart messaging for urgent concerns.    DIET:  We do recommend a small meal at first, but then you may proceed to your regular diet.  Drink plenty of fluids but you should avoid alcoholic beverages for 24  hours.  ACTIVITY:  You should plan to take it easy for the rest of today and you should NOT DRIVE or use heavy machinery until tomorrow (because of the sedation medicines used during the test).    FOLLOW UP: Our staff will call the number listed on your records the next business day following your procedure.  We will call around 7:15- 8:00 am to check on you and address any questions or concerns that you may have regarding the information given to you following your procedure. If we do not reach you, we will leave a message.     If any biopsies were taken you will be contacted by phone or by letter within the next 1-3 weeks.  Please call us  at [phone] if you have not heard about the biopsies in 3 weeks.    SIGNATURES/CONFIDENTIALITY: You and/or your care partner have signed paperwork which will be entered into your electronic medical record.  These signatures attest to the fact that that the information above on your After Visit Summary has been reviewed and is understood.  Full responsibility of the confidentiality of this discharge information lies with you and/or your care-partner.

## 2024-01-24 NOTE — Progress Notes (Signed)
 Vss nad trans to pacu

## 2024-01-24 NOTE — Op Note (Signed)
 [facility] Patient Name: Bradley Stephenson Procedure Date: 01/24/2024 3:54 PM MRN: [id number] Endoscopist: Inocente Hausen , MD, [id number] Age: 53 Referring MD:  Date of Birth: April 08, 1971 Gender: Male Account #: [id number] Procedure:                Colonoscopy Indications:              Colon cancer screening in patient at increased                            risk: Family history of 1st-degree relative with                            colon polyps, This is the patient's first                            colonoscopy Medicines:                Monitored Anesthesia Care Procedure:                Pre-Anesthesia Assessment:                           - Prior to the procedure, a History and Physical                            was performed, and patient medications and                            allergies were reviewed. The patient's tolerance of                            previous anesthesia was also reviewed. The risks                            and benefits of the procedure and the sedation                            options and risks were discussed with the patient.                            All questions were answered, and informed consent                            was obtained. Prior Anticoagulants: The patient has                            taken no anticoagulant or antiplatelet agents. ASA                            Grade Assessment: II - A patient with mild systemic                            disease. After reviewing the risks and benefits,  the patient was deemed in satisfactory condition to                            undergo the procedure.                           After obtaining informed consent, the colonoscope                            was passed under direct vision. Throughout the                            procedure, the patient's blood pressure, pulse, and                            oxygen saturations were monitored continuously. The                             CF [id number] #[id number] was introduced through the anus                            and advanced to the cecum, identified by                            appendiceal orifice and ileocecal valve. The                            colonoscopy was performed without difficulty. The                            patient tolerated the procedure well. The quality                            of the bowel preparation was adequate. The                            ileocecal valve, appendiceal orifice, and rectum                            were photographed. Scope In: 4:11:38 PM Scope Out: 4:30:00 PM Scope Withdrawal Time: 0 hours 12 minutes 24 seconds  Total Procedure Duration: 0 hours 18 minutes 22 seconds  Findings:                 Skin tags were found on perianal exam.                           The digital rectal exam was normal. Pertinent                            negatives include normal sphincter tone and no                            palpable rectal lesions.  The colon (entire examined portion) appeared normal.                           Internal hemorrhoids were found during retroflexion. Complications:            No immediate complications. Estimated blood loss:                            Minimal. Estimated Blood Loss:     Estimated blood loss was minimal. Impression:               - Perianal skin tags found on perianal exam.                           - The entire examined colon is normal.                           - Internal hemorrhoids.                           - No specimens collected. Recommendation:           - Discharge patient to home (ambulatory).                           - Repeat colonoscopy in 10 years for screening                            purposes. Patient reports that his mother had colon                            polyps and possibly a form of colon surgery related                            to this. If patient is able to obtain additional                             information we will request that he forward this to                            our office so that we can determine if his next                            colonoscopy interval needs to be adjusted. If his                            mother had an advanced adenoma he would be due in 5                            years for next colonoscopy.                           - The findings and recommendations were discussed  with the patient's family.                           - Return to referring physician.                           - Patient has a contact number available for                            emergencies. The signs and symptoms of potential                            delayed complications were discussed with the                            patient. Return to normal activities tomorrow.                            Written discharge instructions were provided to the                            patient. Inocente Hausen, MD 01/24/2024 4:35:33 PM This report has been signed electronically.

## 2024-01-25 ENCOUNTER — Telehealth: Payer: Self-pay

## 2024-01-25 NOTE — Telephone Encounter (Signed)
  Follow up Call-     01/24/2024    3:02 PM  Call back number  Post procedure Call Back phone  # [phone]  Permission to leave phone message Yes     Patient questions:  Do you have a fever, pain , or abdominal swelling? No. Pain Score  0 *  Have you tolerated food without any problems? Yes.    Have you been able to return to your normal activities? Yes.    Do you have any questions about your discharge instructions: Diet   No. Medications  No. Follow up visit  No.  Do you have questions or concerns about your Care? No.  Actions: * If pain score is 4 or above: No action needed, pain <4.

## 2024-03-09 ENCOUNTER — Ambulatory Visit (INDEPENDENT_AMBULATORY_CARE_PROVIDER_SITE_OTHER): Admitting: Internal Medicine

## 2024-03-09 ENCOUNTER — Encounter: Payer: Self-pay | Admitting: Internal Medicine

## 2024-03-09 VITALS — BP 138/88 | HR 80 | Temp 97.7°F | Resp 16 | Ht 70.0 in | Wt 158.2 lb

## 2024-03-09 DIAGNOSIS — I10 Essential (primary) hypertension: Secondary | ICD-10-CM | POA: Diagnosis not present

## 2024-03-09 DIAGNOSIS — Z0000 Encounter for general adult medical examination without abnormal findings: Secondary | ICD-10-CM | POA: Diagnosis not present

## 2024-03-09 DIAGNOSIS — Z23 Encounter for immunization: Secondary | ICD-10-CM | POA: Diagnosis not present

## 2024-03-09 DIAGNOSIS — E785 Hyperlipidemia, unspecified: Secondary | ICD-10-CM | POA: Diagnosis not present

## 2024-03-09 DIAGNOSIS — Z09 Encounter for follow-up examination after completed treatment for conditions other than malignant neoplasm: Secondary | ICD-10-CM

## 2024-03-09 MED ORDER — AMLODIPINE BESYLATE 2.5 MG PO TABS: 2.5000 mg | ORAL_TABLET | Freq: Every day | ORAL | 4 refills | Status: DC

## 2024-03-09 NOTE — Patient Instructions (Signed)
  Go to the front desk for the checkout Arrange a visit to the lab next week, fasting, early in the morning for blood work Arrange office visit with me in 4 months  Start taking amlodipine 2.5 mg: 1 tablet a day, time of your choice This is a low-dose, if your blood pressure is not gradually increasing we could increase to 5 mg daily If you have any side effects or problems we could switch to another medication  Continue checking your blood pressure regularly Blood pressure goal:  between 110/65 and  135/85. If it is consistently higher or lower, let me know

## 2024-03-09 NOTE — Progress Notes (Signed)
   Subjective:    Patient ID: Bradley Stephenson, male    DOB: 09-25-1970, 52 y.o.   MRN: [id number]  DOS:  03/09/2024 CPX  Here for CPX. Reports a significant amount of stress, work-related. Ambulatory BPs are never less than 130 and as high as 150-160/110.  Denies chest pain, difficulty breathing, headaches or nosebleeds. He is extremely active.  Wt Readings from Last 3 Encounters:  03/09/24 158 lb 4 oz (71.8 kg)  01/24/24 175 lb (79.4 kg)  12/05/23 161 lb (73 kg)     Review of Systems  Other than above, a 14 point review of systems is negative    Past Medical History:  Diagnosis Date   Allergy    with allergic asthma -- well controlled; is on injections every 3 weeks   Hyperlipidemia     Past Surgical History:  Procedure Laterality Date   ACHILLES TENDON REPAIR Right 06/2020   EYE SURGERY     Lasic   KNEE ARTHROSCOPY W/ ACL RECONSTRUCTION Bilateral 2008,2011   From soccer    Current Outpatient Medications  Medication Instructions   amLODipine (NORVASC) 2.5 mg, Oral, Daily   aspirin EC 81 mg, Daily       Objective:   Physical Exam BP 138/88   Pulse 80   Temp 97.7 F (36.5 C) (Oral)   Resp 16   Ht 5' 10 (1.778 m)   Wt 158 lb 4 oz (71.8 kg)   SpO2 98%   BMI 22.71 kg/m  General: Well developed, NAD, BMI noted Neck: No  thyromegaly  HEENT:  Normocephalic . Face symmetric, atraumatic Lungs:  CTA B Normal respiratory effort, no intercostal retractions, no accessory muscle use. Heart: RRR,  no murmur.  Abdomen:  Not distended, soft, non-tender. No rebound or rigidity.   Lower extremities: no pretibial edema bilaterally  Skin: Exposed areas without rash. Not pale. Not jaundice Neurologic:  alert & oriented X3.  Speech normal, gait appropriate for age and unassisted Strength symmetric and appropriate for age.  Psych: Cognition and judgment appear intact.  Cooperative with normal attention span and concentration.  Behavior appropriate. No anxious or  depressed appearing.     Assessment     ASSESSMENT--established 2025 Elevated BP without dx of HTN Hyperlipidemia Asthma   PLAN Here for CPX -Tdap 2021 -Flu shot today.  Recommend a COVID booster, he is not inclined to proceed. -CCS:  scope 01/2024, next per GI - prostate ca: no FH, no sxs, last PSA 09/2023 wnl  - Lifestyle: Doing great, eats healthy, exercises regularly. - EtOH: Guidelines reviewed, no more than 1 serving a day as recommended - Labs: Will come back fasting for CMP FLP A1c TSH and a total testosterone  requested by the patient.  Other issues: HTN: BP in the ambulatory setting have been high frequently. REC medication, he is inclined to proceed. Requested medication with a very low side effect profile. Recommend amlodipine 2.5 mg, increase to 5 mg as needed.  Side effects includes edema. His brother is a doctor and he recommended ARB, if amlodipine is not effective we will consider ARB Dyslipidemia: Lifestyle controlled, checking FLP. RTC 4 months

## 2024-03-11 ENCOUNTER — Encounter: Payer: Self-pay | Admitting: Internal Medicine

## 2024-03-11 NOTE — Assessment & Plan Note (Signed)
 Here for CPX Other issues: HTN: BP in the ambulatory setting have been high frequently. REC medication, he is inclined to proceed. Requested medication with a very low side effect profile. Recommend amlodipine 2.5 mg, increase to 5 mg as needed.  Side effects includes edema. His brother is a doctor and he recommended ARB, if amlodipine is not effective we will consider ARB Dyslipidemia: Lifestyle controlled, checking FLP. RTC 4 months

## 2024-03-11 NOTE — Assessment & Plan Note (Signed)
 Here for CPX -Tdap 2021 -Flu shot today.  Recommend a COVID booster, he is not inclined to proceed. -CCS:  scope 01/2024, next per GI - prostate ca: no FH, no sxs, last PSA 09/2023 wnl  - Lifestyle: Doing great, eats healthy, exercises regularly. - EtOH: Guidelines reviewed, no more than 1 serving a day as recommended - Labs: Will come back fasting for CMP FLP A1c TSH and a total testosterone  requested by the patient.

## 2024-03-12 ENCOUNTER — Other Ambulatory Visit (INDEPENDENT_AMBULATORY_CARE_PROVIDER_SITE_OTHER)

## 2024-03-12 DIAGNOSIS — E785 Hyperlipidemia, unspecified: Secondary | ICD-10-CM

## 2024-03-12 DIAGNOSIS — I10 Essential (primary) hypertension: Secondary | ICD-10-CM | POA: Diagnosis not present

## 2024-03-12 DIAGNOSIS — Z0000 Encounter for general adult medical examination without abnormal findings: Secondary | ICD-10-CM | POA: Diagnosis not present

## 2024-03-12 LAB — COMPREHENSIVE METABOLIC PANEL WITH GFR
ALT: 24 U/L (ref 0–53)
AST: 60 U/L — ABNORMAL HIGH (ref 0–37)
Albumin: 4.4 g/dL (ref 3.5–5.2)
Alkaline Phosphatase: 33 U/L — ABNORMAL LOW (ref 39–117)
BUN: 14 mg/dL (ref 6–23)
CO2: 30 meq/L (ref 19–32)
Calcium: 9.5 mg/dL (ref 8.4–10.5)
Chloride: 101 meq/L (ref 96–112)
Creatinine, Ser: 1.07 mg/dL (ref 0.40–1.50)
GFR: 80.13 mL/min (ref >60.00)
Glucose, Bld: 92 mg/dL (ref 70–99)
Potassium: 3.9 meq/L (ref 3.5–5.1)
Sodium: 140 meq/L (ref 135–145)
Total Bilirubin: 0.8 mg/dL (ref 0.2–1.2)
Total Protein: 7.1 g/dL (ref 6.0–8.3)

## 2024-03-12 LAB — TSH: TSH: 1.61 u[IU]/mL (ref 0.35–5.50)

## 2024-03-12 LAB — LIPID PANEL
Cholesterol: 239 mg/dL — ABNORMAL HIGH (ref 0–200)
HDL: 75.6 mg/dL (ref >39.00)
LDL Cholesterol: 147 mg/dL — ABNORMAL HIGH (ref 0–99)
NonHDL: 163.71
Total CHOL/HDL Ratio: 3
Triglycerides: 84 mg/dL (ref 0.0–149.0)
VLDL: 16.8 mg/dL (ref 0.0–40.0)

## 2024-03-12 LAB — TESTOSTERONE: Testosterone: 467.93 ng/dL (ref 300.00–890.00)

## 2024-03-12 LAB — HEMOGLOBIN A1C: Hgb A1c MFr Bld: 5.8 % (ref 4.6–6.5)

## 2024-03-14 ENCOUNTER — Ambulatory Visit: Payer: Self-pay | Admitting: Internal Medicine

## 2024-03-30 ENCOUNTER — Encounter: Payer: Self-pay | Admitting: Internal Medicine

## 2024-04-21 ENCOUNTER — Other Ambulatory Visit: Payer: Self-pay | Admitting: Internal Medicine

## 2024-07-13 ENCOUNTER — Ambulatory Visit: Admitting: Internal Medicine
